# Patient Record
Sex: Male | Born: 1935 | Race: White | Hispanic: No | Marital: Married | Smoking: Never smoker
Health system: Southern US, Community
[De-identification: ages and names within clinical notes are randomized; demographics above are authoritative.]

## PROBLEM LIST (undated history)

## (undated) DIAGNOSIS — C801 Malignant (primary) neoplasm, unspecified: Secondary | ICD-10-CM

## (undated) DIAGNOSIS — G4733 Obstructive sleep apnea (adult) (pediatric): Secondary | ICD-10-CM

## (undated) DIAGNOSIS — G20A1 Parkinson's disease without dyskinesia, without mention of fluctuations: Secondary | ICD-10-CM

## (undated) DIAGNOSIS — I10 Essential (primary) hypertension: Secondary | ICD-10-CM

## (undated) DIAGNOSIS — I4891 Unspecified atrial fibrillation: Secondary | ICD-10-CM

## (undated) DIAGNOSIS — G20 Parkinson's disease: Secondary | ICD-10-CM

## (undated) HISTORY — PX: HERNIA REPAIR: SHX51

## (undated) HISTORY — DX: Parkinson's disease without dyskinesia, without mention of fluctuations: G20.A1

## (undated) HISTORY — DX: Unspecified atrial fibrillation: I48.91

## (undated) HISTORY — DX: Parkinson's disease: G20

## (undated) HISTORY — PX: HEMORRHOID SURGERY: SHX153

## (undated) HISTORY — DX: Essential (primary) hypertension: I10

## (undated) SURGERY — Surgical Case
Anesthesia: *Unknown

---

## 2004-05-17 ENCOUNTER — Ambulatory Visit: Payer: Self-pay | Admitting: Urology

## 2005-02-16 ENCOUNTER — Ambulatory Visit: Payer: Self-pay | Admitting: Radiation Oncology

## 2005-09-19 ENCOUNTER — Ambulatory Visit: Payer: Self-pay | Admitting: Ophthalmology

## 2006-02-15 ENCOUNTER — Ambulatory Visit: Payer: Self-pay | Admitting: Radiation Oncology

## 2006-03-01 ENCOUNTER — Ambulatory Visit: Payer: Self-pay | Admitting: Radiation Oncology

## 2008-12-24 ENCOUNTER — Encounter: Admission: RE | Admit: 2008-12-24 | Discharge: 2008-12-24 | Payer: Self-pay | Admitting: Neurology

## 2011-01-07 ENCOUNTER — Emergency Department: Payer: Self-pay | Admitting: Emergency Medicine

## 2013-11-06 ENCOUNTER — Emergency Department: Payer: Self-pay | Admitting: Emergency Medicine

## 2013-11-06 LAB — CBC WITH DIFFERENTIAL/PLATELET
BASOS PCT: 1.1 %
Basophil #: 0.1 10*3/uL (ref 0.0–0.1)
Eosinophil #: 0.4 10*3/uL (ref 0.0–0.7)
Eosinophil %: 5.8 %
HCT: 43.6 % (ref 40.0–52.0)
HGB: 14.2 g/dL (ref 13.0–18.0)
LYMPHS ABS: 1.4 10*3/uL (ref 1.0–3.6)
Lymphocyte %: 20.8 %
MCH: 29.6 pg (ref 26.0–34.0)
MCHC: 32.6 g/dL (ref 32.0–36.0)
MCV: 91 fL (ref 80–100)
Monocyte #: 0.8 x10 3/mm (ref 0.2–1.0)
Monocyte %: 12.3 %
NEUTROS ABS: 4 10*3/uL (ref 1.4–6.5)
NEUTROS PCT: 60 %
Platelet: 229 10*3/uL (ref 150–440)
RBC: 4.81 10*6/uL (ref 4.40–5.90)
RDW: 14.1 % (ref 11.5–14.5)
WBC: 6.7 10*3/uL (ref 3.8–10.6)

## 2013-11-06 LAB — COMPREHENSIVE METABOLIC PANEL
ALBUMIN: 3.4 g/dL (ref 3.4–5.0)
ALT: 30 U/L (ref 12–78)
Alkaline Phosphatase: 117 U/L
Anion Gap: 10 (ref 7–16)
BUN: 15 mg/dL (ref 7–18)
Bilirubin,Total: 0.9 mg/dL (ref 0.2–1.0)
CHLORIDE: 96 mmol/L — AB (ref 98–107)
CO2: 28 mmol/L (ref 21–32)
CREATININE: 1.13 mg/dL (ref 0.60–1.30)
Calcium, Total: 8.7 mg/dL (ref 8.5–10.1)
EGFR (African American): >60
GFR CALC NON AF AMER: 58 — AB
Glucose: 102 mg/dL — ABNORMAL HIGH (ref 65–99)
Osmolality: 269 (ref 275–301)
Potassium: 4.5 mmol/L (ref 3.5–5.1)
SGOT(AST): 29 U/L (ref 15–37)
SODIUM: 134 mmol/L — AB (ref 136–145)
Total Protein: 7.2 g/dL (ref 6.4–8.2)

## 2013-11-06 LAB — URINALYSIS, COMPLETE
BACTERIA: NONE SEEN
BILIRUBIN, UR: NEGATIVE
BLOOD: NEGATIVE
GLUCOSE, UR: NEGATIVE mg/dL (ref 0–75)
KETONE: NEGATIVE
LEUKOCYTE ESTERASE: NEGATIVE
NITRITE: NEGATIVE
PH: 6 (ref 4.5–8.0)
PROTEIN: NEGATIVE
SPECIFIC GRAVITY: 1.01 (ref 1.003–1.030)
SQUAMOUS EPITHELIAL: NONE SEEN
WBC UR: NONE SEEN /HPF (ref 0–5)

## 2013-11-06 LAB — LIPASE, BLOOD: LIPASE: 105 U/L (ref 73–393)

## 2014-12-30 ENCOUNTER — Ambulatory Visit (INDEPENDENT_AMBULATORY_CARE_PROVIDER_SITE_OTHER): Payer: Medicare Other | Admitting: Podiatry

## 2014-12-30 ENCOUNTER — Encounter: Payer: Self-pay | Admitting: Podiatry

## 2014-12-30 VITALS — BP 99/70 | HR 73 | Resp 18

## 2014-12-30 DIAGNOSIS — M79676 Pain in unspecified toe(s): Secondary | ICD-10-CM | POA: Diagnosis not present

## 2014-12-30 DIAGNOSIS — G20 Parkinson's disease: Secondary | ICD-10-CM | POA: Diagnosis not present

## 2014-12-30 DIAGNOSIS — B351 Tinea unguium: Secondary | ICD-10-CM

## 2014-12-30 NOTE — Progress Notes (Signed)
   Subjective:    Patient ID: Gavin Beltran., male    DOB: 09-11-1923, 81 y.o.   MRN: [id number]  HPI I HAVE SOME THICK TOENAILS AND ARE LONG AND DON'T GROW LIKE TOENAILS SHOULD AND I WAS A BORDERLINE DIABETIC BUT NOT SURE IF I AM NOW OR NOT AND DISCOLORED AND TENDER AND THEY CURL UP AND CURL IN.  He states that he has trouble walking not only due to the Parkinson's disease but also because of his painful toenails. He sees a curl up and run the shoes. He is in varus that his toenails are this long. He presents with his wife today.    Review of Systems  HENT: Positive for trouble swallowing.   Eyes: Positive for visual disturbance.  Gastrointestinal: Positive for diarrhea and constipation.  Skin:       Thick nails  Neurological: Positive for light-headedness.  Hematological: Bruises/bleeds easily.  All other systems reviewed and are negative.      Objective:   Physical Exam: 81 year old white male history of Parkinson's disease. Pulses are strongly palpable bilateral. Neurologic sensorium is intact per Semmes-Weinstein monofilament. Deep tendon reflexes are non-was supple muscle strength +5 over 5 dorsiflexion plantar flexors and inverters and everters all intrinsic musculature is intact. He's quite fidgety and does demonstrate resting tremors. Orthopedic evaluation demonstrates pes planus bilateral mild flexible hammertoe deformities bilateral. Otherwise all joints distal to the ankle for range of motion upper crepitation. Cutaneous evaluation and x-rays thick yellow dystrophic onychomycotic painful nails on palpation as well as debridement.        Assessment & Plan:  Assessment: Parkinson's disease with resting tremors and painful elongated toenails 1 through 5 bilateral.  Plan: Discussed etiology pathology conservative versus surgical therapies. Debridement of nails 1 through 5 bilateral and 613.

## 2015-03-29 ENCOUNTER — Ambulatory Visit: Payer: Medicare Other | Admitting: Podiatry

## 2015-03-31 ENCOUNTER — Ambulatory Visit (INDEPENDENT_AMBULATORY_CARE_PROVIDER_SITE_OTHER): Payer: Medicare Other | Admitting: Podiatry

## 2015-03-31 ENCOUNTER — Encounter: Payer: Self-pay | Admitting: Podiatry

## 2015-03-31 DIAGNOSIS — B351 Tinea unguium: Secondary | ICD-10-CM | POA: Diagnosis not present

## 2015-03-31 DIAGNOSIS — M79676 Pain in unspecified toe(s): Secondary | ICD-10-CM

## 2015-03-31 NOTE — Progress Notes (Signed)
He presents today with his wife with a chief complaint of painful elongated toenails bilateral.  Objective: Pulses are strongly palpable bilateral. No open wounds or lesions. His tenderness of thick yellow dystrophic with mycotic painful on palpation.  Assessment: Pain in limb segment onychomycosis 1 through 5 bilateral.  Plan: Debridement of toenails 1 through 5 bilateral cover service secondary to pain. Follow-up with me in 3 months.

## 2015-05-29 ENCOUNTER — Emergency Department
Admission: EM | Admit: 2015-05-29 | Discharge: 2015-05-29 | Disposition: A | Discharge status: Home / Self Care | Payer: Medicare Other | Attending: Emergency Medicine | Admitting: Emergency Medicine

## 2015-05-29 ENCOUNTER — Emergency Department: Payer: Medicare Other

## 2015-05-29 ENCOUNTER — Encounter: Payer: Self-pay | Admitting: Emergency Medicine

## 2015-05-29 DIAGNOSIS — Z79899 Other long term (current) drug therapy: Secondary | ICD-10-CM | POA: Insufficient documentation

## 2015-05-29 DIAGNOSIS — G20 Parkinson's disease: Secondary | ICD-10-CM | POA: Insufficient documentation

## 2015-05-29 DIAGNOSIS — K219 Gastro-esophageal reflux disease without esophagitis: Secondary | ICD-10-CM | POA: Diagnosis present

## 2015-05-29 DIAGNOSIS — Z7982 Long term (current) use of aspirin: Secondary | ICD-10-CM | POA: Insufficient documentation

## 2015-05-29 DIAGNOSIS — Q390 Atresia of esophagus without fistula: Secondary | ICD-10-CM | POA: Diagnosis not present

## 2015-05-29 LAB — COMPREHENSIVE METABOLIC PANEL
ALT: 21 U/L (ref 17–63)
AST: 31 U/L (ref 15–41)
Albumin: 4 g/dL (ref 3.5–5.0)
Alkaline Phosphatase: 99 U/L (ref 38–126)
Anion gap: 9 (ref 5–15)
BILIRUBIN TOTAL: 1.8 mg/dL — AB (ref 0.3–1.2)
BUN: 19 mg/dL (ref 6–20)
CO2: 26 mmol/L (ref 22–32)
Calcium: 9 mg/dL (ref 8.9–10.3)
Chloride: 100 mmol/L — ABNORMAL LOW (ref 101–111)
Creatinine, Ser: 1 mg/dL (ref 0.61–1.24)
Glucose, Bld: 102 mg/dL — ABNORMAL HIGH (ref 65–99)
POTASSIUM: 4.3 mmol/L (ref 3.5–5.1)
Sodium: 135 mmol/L (ref 135–145)
TOTAL PROTEIN: 7.3 g/dL (ref 6.5–8.1)

## 2015-05-29 LAB — CBC WITH DIFFERENTIAL/PLATELET
BASOS ABS: 0 10*3/uL (ref 0–0.1)
Basophils Relative: 1 %
EOS PCT: 2 %
Eosinophils Absolute: 0.2 10*3/uL (ref 0–0.7)
HEMATOCRIT: 41.3 % (ref 40.0–52.0)
Hemoglobin: 13.8 g/dL (ref 13.0–18.0)
LYMPHS ABS: 0.7 10*3/uL — AB (ref 1.0–3.6)
LYMPHS PCT: 10 %
MCH: 30 pg (ref 26.0–34.0)
MCHC: 33.3 g/dL (ref 32.0–36.0)
MCV: 90.2 fL (ref 80.0–100.0)
MONO ABS: 0.5 10*3/uL (ref 0.2–1.0)
MONOS PCT: 8 %
NEUTROS ABS: 5.6 10*3/uL (ref 1.4–6.5)
Neutrophils Relative %: 79 %
PLATELETS: 221 10*3/uL (ref 150–440)
RBC: 4.58 MIL/uL (ref 4.40–5.90)
RDW: 14.2 % (ref 11.5–14.5)
WBC: 7.1 10*3/uL (ref 3.8–10.6)

## 2015-05-29 MED ORDER — PANTOPRAZOLE SODIUM 20 MG PO TBEC: 20.0000 mg | DELAYED_RELEASE_TABLET | Freq: Every day | ORAL | Status: AC

## 2015-05-29 NOTE — ED Provider Notes (Signed)
Time Seen: Approximately *1417  I have reviewed the triage notes  Chief Complaint: Gastroesophageal Reflux   History of Present Illness: Gavin Beltran. is a 82 y.o. male who states over the last 2 days with breakfast that he's had grits and juice. He states on both days it felt like something got stuck in the lower chest region. He states today when he got stuck it did not seem to resolve itself and he was unable to tolerate his own oral secretions at home. He describes spitting up a lot. Denies any chest pain or shortness of breath. Patient was in contact with his primary physician was discussed whether or not he would need esophageal dilation therapy. Patient states he is now able tolerate his oral secretions but it still feels like something may be stuck.   Past Medical History  Diagnosis Date  . Parkinson disease ([place])     There are no active problems to display for this patient.   History reviewed. No pertinent past surgical history.  History reviewed. No pertinent past surgical history.  Current Outpatient Rx  Name  Route  Sig  Dispense  Refill  . amiodarone (PACERONE) 200 MG tablet      TAKE ONE-HALF (1/2) TABLET DAILY         . amiodarone (PACERONE) 200 MG tablet               . amoxicillin (AMOXIL) 875 MG tablet               . aspirin EC 81 MG tablet   Oral   Take by mouth.         Marland Kitchen azithromycin (ZITHROMAX) 250 MG tablet               . Multiple Vitamin (MULTI-VITAMINS) TABS   Oral   Take by mouth.         . neomycin-polymyxin b-dexamethasone (MAXITROL) 3.5-10000-0.1 OINT               . pantoprazole (PROTONIX) 20 MG tablet   Oral   Take 1 tablet (20 mg total) by mouth daily.   30 tablet   1   . potassium chloride SA (K-DUR,KLOR-CON) 20 MEQ tablet      TAKE 1 TABLET DAILY         . PROAIR HFA 108 (90 BASE) MCG/ACT inhaler                 Dispense as written.   . ramipril (ALTACE) 5 MG capsule      TAKE 1  CAPSULE DAILY         . rOPINIRole (REQUIP) 1 MG tablet      TAKE 2 TABLETS THREE TIMES A DAY           Allergies:  Gabapentin  Family History: History reviewed. No pertinent family history.  Social History: Social History  Substance Use Topics  . Smoking status: Never Smoker   . Smokeless tobacco: Never Used  . Alcohol Use: No     Review of Systems:   10 point review of systems was performed and was otherwise negative:  Constitutional: No fever Eyes: No visual disturbances ENT: No sore throat, ear pain Cardiac: No chest pain Respiratory: No shortness of breath, wheezing, or stridor Abdomen: No abdominal pain, no vomiting, No diarrhea Endocrine: No weight loss, No night sweats Extremities: No peripheral edema, cyanosis Skin: No rashes, easy bruising Neurologic: No focal weakness, trouble with speech or  swollowing Urologic: No dysuria, Hematuria, or urinary frequency Patient has a known history of Parkinson's and has a baseline resting tremor  Physical Exam:  ED Triage Vitals  Enc Vitals Group     BP 05/29/15 1353 124/83 mmHg     Pulse Rate 05/29/15 1353 79     Resp 05/29/15 1353 18     Temp 05/29/15 1353 97.9 F (36.6 C)     Temp Source 05/29/15 1353 Oral     SpO2 05/29/15 1353 98 %     Weight 05/29/15 1353 160 lb (72.576 kg)     Height 05/29/15 1353 5\' 8"  (1.727 m)     Head Cir --      Peak Flow --      Pain Score 05/29/15 1354 0     Pain Loc --      Pain Edu? --      Excl. in [place]? --     General: Awake , Alert , and Oriented times 3; GCS 15 affects of Parkinson's Head: Normal cephalic , atraumatic Eyes: Pupils equal , round, reactive to light Nose/Throat: No nasal drainage, patent upper airway without erythema or exudate.  Neck: Supple, Full range of motion, No anterior adenopathy or palpable thyroid masses Lungs: Clear to ascultation without wheezes , rhonchi, or rales Heart: Regular rate, regular rhythm without murmurs , gallops , or  rubs Abdomen: Soft, non tender without rebound, guarding , or rigidity; bowel sounds positive and symmetric in all 4 quadrants. No organomegaly .        Extremities: 2 plus symmetric pulses. No edema, clubbing or cyanosis Neurologic: normal ambulation, Motor symmetric without deficits, sensory intact Skin: warm, dry, no rashes   Labs:   All laboratory work was reviewed including any pertinent negatives or positives listed below:  Labs Reviewed  CBC WITH DIFFERENTIAL/PLATELET - Abnormal; Notable for the following:    Lymphs Abs 0.7 (*)    All other components within normal limits  COMPREHENSIVE METABOLIC PANEL - Abnormal; Notable for the following:    Chloride 100 (*)    Glucose, Bld 102 (*)    Total Bilirubin 1.8 (*)    All other components within normal limits     Radiology:      CLINICAL DATA: Difficulty swallowing solid  EXAM: CHEST 2 VIEW  COMPARISON: 01/07/2011  FINDINGS: The heart size and mediastinal contours are within normal limits. Both lungs are clear. The visualized skeletal structures are unremarkable.  IMPRESSION: No active cardiopulmonary disease.   Electronically Signed By: Inez Catalina M.D. On: 05/29/2015 15:06 I personally reviewed the radiologic studies   P  ED Course:  The patient was able tolerate water here in emergency department repeat exam shows that he is asymptomatic. I briefly reviewed the case with Dr. Rayann Heman. We both agree the patient can be discharged at this time and is been advised to have a liquid diet until he can be evaluated on Monday. He most likely does require an upper endoscopic exam. The patient does not appear to have any lesions in his chest x-ray will be causing some form of an external obstruction. He also has no indications of aspiration pneumonia, etc. His swallowing mechanism is intact with his Parkinson's and again we felt we could attempt outpatient management at this time. All questions and concerns were  addressed at the bedside and the patient is medications were reviewed and he was started on protonix Dr. Rayann Heman advised that his office would contact them on Monday to schedule follow-up  and I advised him at the bedside that if he didn't hear by the afternoon to call Dr. Jackalyn Lombard office and all information was provided   Assessment:  Esophageal atresia   Final Clinical Impression: *  Final diagnoses:  Esophageal atresia     Plan: Outpatient management Patient was advised to return immediately if condition worsens. Patient was advised to follow up with their primary care physician or other specialized physicians involved in their outpatient care             Daymon Larsen, MD 05/29/15 1920

## 2015-05-29 NOTE — ED Notes (Signed)
Reports after he eats it comes back up in his throat and has been told by Dr Manuella Ghazi that he needs his esophagus stretched. No resp distress

## 2015-05-29 NOTE — ED Notes (Signed)
Pt given water. Pt able to tolerate the water without any issues.

## 2015-06-03 ENCOUNTER — Other Ambulatory Visit: Payer: Self-pay | Admitting: Student

## 2015-06-03 ENCOUNTER — Other Ambulatory Visit: Payer: Self-pay | Admitting: Neurology

## 2015-06-03 DIAGNOSIS — R1310 Dysphagia, unspecified: Secondary | ICD-10-CM

## 2015-06-03 DIAGNOSIS — T17908A Unspecified foreign body in respiratory tract, part unspecified causing other injury, initial encounter: Secondary | ICD-10-CM

## 2015-06-08 ENCOUNTER — Ambulatory Visit
Admission: RE | Admit: 2015-06-08 | Discharge: 2015-06-08 | Disposition: A | Discharge status: Home / Self Care | Payer: Medicare Other | Source: Ambulatory Visit | Attending: Neurology | Admitting: Neurology

## 2015-06-08 DIAGNOSIS — R1312 Dysphagia, oropharyngeal phase: Secondary | ICD-10-CM

## 2015-06-08 DIAGNOSIS — T17908A Unspecified foreign body in respiratory tract, part unspecified causing other injury, initial encounter: Secondary | ICD-10-CM

## 2015-06-08 DIAGNOSIS — R1310 Dysphagia, unspecified: Secondary | ICD-10-CM | POA: Diagnosis not present

## 2015-06-09 NOTE — Therapy (Signed)
[facility] [place], [place], [address] Phone: [phone]   Fax:     Modified Barium Swallow  Patient Details  Name: Gavin Beltran. MRN: [id number] Date of Birth: [birth year] No Data Recorded  Encounter Date: 06/08/2015      End of Session - 06/09/15 [id number]    Visit Number 1   Number of Visits 1   Date for SLP Re-Evaluation 06/08/15   SLP Start Time 1300   SLP Stop Time  1355   SLP Time Calculation (min) 55 min   Activity Tolerance Patient tolerated treatment well      Past Medical History  Diagnosis Date  . Parkinson disease ([place])     No past surgical history on file.  There were no vitals filed for this visit.  Visit Diagnosis: Oropharyngeal dysphagia  Aspiration into airway, initial encounter - Plan: DG OP Swallowing Func-Medicare/Speech Path, DG OP Swallowing Func-Medicare/Speech Path     Subjective: Patient behavior: (alertness, ability to follow instructions, etc.): Patient verbal and able to follow directions.  Patient exhibits hypophonia and hoarse vocal quality.   Chief complaint: difficulty swallowing, foods getting stuck   Objective:  Radiological Procedure: A videoflouroscopic evaluation of oral-preparatory, reflex initiation, and pharyngeal phases of the swallow was performed; as well as a screening of the upper esophageal phase.  I. POSTURE: Upright in MBS chair  II. VIEW: Lateral  III. COMPENSATORY STRATEGIES: Voluntary swallow, minimal clearance of solid pharyngeal residue  IV. BOLUSES ADMINISTERED:   Thin Liquid: 2 small, 2 rapid consecutive swallows   Nectar-thick Liquid: 1 medium size bolus    Puree: 2 teaspoon boluses   Mechanical Soft: 1/4 graham cracker in applesauce    Barium tablet  V. RESULTS OF EVALUATION: A. ORAL PREPARATORY PHASE: (The lips, tongue, and velum are observed for strength and coordination): Mild disorganization       **Overall Severity  Rating: Mild   B. SWALLOW INITIATION/REFLEX: (The reflex is normal if "triggered" by the time the bolus reached the base of the tongue): Mild delay with liquids   **Overall Severity Rating: Mild  C. PHARYNGEAL PHASE: (Pharyngeal function is normal if the bolus shows rapid, smooth, and continuous transit through the pharynx and there is no pharyngeal residue after the swallow): reduced hyolaryngeal movement, reduced pharyngeal pressure generation, moderate residue with solids, less with thinner consistencies  **Overall Severity Rating: Mild-moderate  D. LARYNGEAL PENETRATION: (Material entering into the laryngeal inlet/vestibule but not aspirated) Transient penetration with nectar-thick and thin liquids   ASPIRATION: None  E. ESOPHAGEAL PHASE: (Screening of the upper esophagus): barium tablet traveled through without significant stasis  ASSESSMENT: This 82 year old man; with Parkinsons's disease and difficulty swallowing; is presenting with mild oropharyngeal dysphagia.  Oral control of the bolus including oral hold, rotary mastication, and anterior to posterior transfer is mildly disorganized.  Timing of the pharyngeal swallow is mildly delayed.  Pharyngeal aspects of the swallow including hyolaryngeal excursion, pharyngeal pressure generation, epiglottic inversion, duration/amplitude of UES opening, and laryngeal vestibule closure at the height of the swallow are reduced.  There is moderate pharyngeal residue of solid consistencies, mild pharyngeal residue with nectar thick liquids, and trace-to-mild residue with thin liquids.  The patient demonstrates significantly improved hyolaryngeal movement with thinner consistencies, indicative of increased effort and resulting in less pharyngeal residue.  There was transient laryngeal penetration, without aspiration, with liquid consistencies.  A 12.5 mm barium tablet moved through the esophagus and into the stomach without  significant stasis along the way.   Recommend the patient continue his usual diet, alternating solids and liquids to aid pharyngeal clearance.  In view of the hypophonia, hoarse vocal quality, and reduced effort of pharyngeal swallow; it is recommended that the patient participate in high effort/high intensity vocal exercises (such as the LSVT-LOUD program).  LSVT-LOUD improves neuromuscular control of the entire upper aerodigestive tract, improving oral tongue and tongue base function during the oral and pharyngeal phases of swallowing as well as improving vocal intensity.    PLAN/RECOMMENDATIONS:   A. Diet: regular   B. Swallowing Precautions: Alternate liquids and solids   C. Recommended consultation to: per MD recommendations   D. Therapy recommendations: LSVT-LOUD   E. Results and recommendations were discussed with the patient and his wife and the final report will be routed to the referring MD.            G-Codes - 06/22/15 0944    Functional Assessment Tool Used MBS, clinical judgment   Functional Limitations Swallowing   Swallow Current Status [id number]) At least 20 percent but less than 40 percent impaired, limited or restricted   Swallow Goal Status [id number]) At least 20 percent but less than 40 percent impaired, limited or restricted   Swallow Discharge Status [phone]) At least 20 percent but less than 40 percent impaired, limited or restricted          Problem List There are no active problems to display for this patient.  Gavin Sea, MS/CCC- SLP  Gavin Beltran June 22, 2015, 9:45 AM  [facility] [place], [place], [address] Phone: [phone]   Fax:     Name: Gavin Beltran. MRN: [id number] Date of Birth: [birth year]

## 2015-06-10 ENCOUNTER — Ambulatory Visit: Payer: Medicare Other

## 2015-06-30 ENCOUNTER — Ambulatory Visit (INDEPENDENT_AMBULATORY_CARE_PROVIDER_SITE_OTHER): Payer: Medicare Other | Admitting: Podiatry

## 2015-06-30 ENCOUNTER — Encounter: Payer: Self-pay | Admitting: Podiatry

## 2015-06-30 ENCOUNTER — Ambulatory Visit: Payer: Medicare Other | Admitting: Podiatry

## 2015-06-30 DIAGNOSIS — B351 Tinea unguium: Secondary | ICD-10-CM | POA: Diagnosis not present

## 2015-06-30 DIAGNOSIS — M79676 Pain in unspecified toe(s): Secondary | ICD-10-CM

## 2015-06-30 NOTE — Progress Notes (Signed)
Gavin Beltran presents today with a chief complaint of painful elongated toenails.  Objective: Vital signs are stable alert and oriented 3. Pulses are palpable. Toenails are thick yellow dystrophic with mycotic and painful on palpation.  Assessment: Pain in limb secondary to onychomycosis 1 through 5 bilateral.  Plan: Follow-up with him in 3 months debrided toenails 1 through 5 bilateral.

## 2015-07-05 ENCOUNTER — Ambulatory Visit: Payer: Medicare Other | Admitting: Speech Pathology

## 2015-07-06 ENCOUNTER — Ambulatory Visit: Payer: Medicare Other | Admitting: Speech Pathology

## 2015-07-07 ENCOUNTER — Ambulatory Visit: Payer: Medicare Other | Admitting: Speech Pathology

## 2015-07-08 ENCOUNTER — Ambulatory Visit: Payer: Medicare Other | Attending: Otolaryngology

## 2015-07-08 ENCOUNTER — Ambulatory Visit: Payer: Medicare Other | Admitting: Speech Pathology

## 2015-07-08 DIAGNOSIS — I4891 Unspecified atrial fibrillation: Secondary | ICD-10-CM | POA: Insufficient documentation

## 2015-07-08 DIAGNOSIS — G4733 Obstructive sleep apnea (adult) (pediatric): Secondary | ICD-10-CM | POA: Diagnosis not present

## 2015-07-08 DIAGNOSIS — R0683 Snoring: Secondary | ICD-10-CM | POA: Insufficient documentation

## 2015-07-08 DIAGNOSIS — I10 Essential (primary) hypertension: Secondary | ICD-10-CM | POA: Diagnosis present

## 2015-07-12 ENCOUNTER — Ambulatory Visit: Payer: Medicare Other | Admitting: Speech Pathology

## 2015-07-13 ENCOUNTER — Encounter: Payer: Medicare Other | Admitting: Speech Pathology

## 2015-07-13 ENCOUNTER — Ambulatory Visit: Payer: Medicare Other | Attending: Otolaryngology

## 2015-07-13 ENCOUNTER — Ambulatory Visit: Payer: Medicare Other | Admitting: Speech Pathology

## 2015-07-13 DIAGNOSIS — G4733 Obstructive sleep apnea (adult) (pediatric): Secondary | ICD-10-CM | POA: Insufficient documentation

## 2015-07-13 DIAGNOSIS — R0683 Snoring: Secondary | ICD-10-CM | POA: Diagnosis not present

## 2015-07-14 ENCOUNTER — Ambulatory Visit: Payer: Medicare Other | Admitting: Speech Pathology

## 2015-07-14 ENCOUNTER — Encounter: Payer: Medicare Other | Admitting: Speech Pathology

## 2015-07-15 ENCOUNTER — Encounter: Payer: Medicare Other | Admitting: Speech Pathology

## 2015-07-15 ENCOUNTER — Ambulatory Visit: Payer: Medicare Other | Admitting: Speech Pathology

## 2015-07-16 ENCOUNTER — Encounter: Payer: Medicare Other | Admitting: Speech Pathology

## 2015-07-19 ENCOUNTER — Ambulatory Visit: Payer: Medicare Other | Admitting: Speech Pathology

## 2015-07-20 ENCOUNTER — Ambulatory Visit: Payer: Medicare Other | Admitting: Speech Pathology

## 2015-07-20 ENCOUNTER — Encounter: Payer: Medicare Other | Admitting: Speech Pathology

## 2015-07-21 ENCOUNTER — Encounter: Payer: Medicare Other | Admitting: Speech Pathology

## 2015-07-21 ENCOUNTER — Ambulatory Visit: Payer: Medicare Other | Admitting: Speech Pathology

## 2015-07-22 ENCOUNTER — Ambulatory Visit: Payer: Medicare Other | Admitting: Speech Pathology

## 2015-07-22 ENCOUNTER — Encounter: Payer: Medicare Other | Admitting: Speech Pathology

## 2015-07-23 ENCOUNTER — Encounter: Payer: Medicare Other | Admitting: Speech Pathology

## 2015-07-26 ENCOUNTER — Ambulatory Visit: Payer: Medicare Other | Admitting: Speech Pathology

## 2015-07-27 ENCOUNTER — Ambulatory Visit: Payer: Medicare Other | Admitting: Speech Pathology

## 2015-07-27 ENCOUNTER — Encounter: Payer: Medicare Other | Admitting: Speech Pathology

## 2015-07-28 ENCOUNTER — Encounter: Payer: Medicare Other | Admitting: Speech Pathology

## 2015-07-28 ENCOUNTER — Ambulatory Visit: Payer: Medicare Other | Admitting: Speech Pathology

## 2015-07-29 ENCOUNTER — Encounter: Payer: Medicare Other | Admitting: Speech Pathology

## 2015-07-29 ENCOUNTER — Ambulatory Visit: Payer: Medicare Other | Admitting: Speech Pathology

## 2015-07-30 ENCOUNTER — Encounter: Payer: Medicare Other | Admitting: Speech Pathology

## 2015-08-02 ENCOUNTER — Ambulatory Visit: Payer: Medicare Other | Admitting: Speech Pathology

## 2015-08-03 ENCOUNTER — Encounter: Payer: Medicare Other | Admitting: Speech Pathology

## 2015-08-04 ENCOUNTER — Encounter: Payer: Medicare Other | Admitting: Speech Pathology

## 2015-08-05 ENCOUNTER — Encounter: Payer: Medicare Other | Admitting: Speech Pathology

## 2015-08-06 ENCOUNTER — Encounter: Payer: Medicare Other | Admitting: Speech Pathology

## 2015-08-10 ENCOUNTER — Encounter: Payer: Medicare Other | Admitting: Speech Pathology

## 2015-10-04 ENCOUNTER — Ambulatory Visit: Payer: Medicare Other | Admitting: Podiatry

## 2015-10-13 ENCOUNTER — Ambulatory Visit (INDEPENDENT_AMBULATORY_CARE_PROVIDER_SITE_OTHER): Payer: Medicare Other | Admitting: Podiatry

## 2015-10-13 ENCOUNTER — Encounter: Payer: Self-pay | Admitting: Podiatry

## 2015-10-13 DIAGNOSIS — M79676 Pain in unspecified toe(s): Secondary | ICD-10-CM | POA: Diagnosis not present

## 2015-10-13 DIAGNOSIS — B351 Tinea unguium: Secondary | ICD-10-CM

## 2015-10-13 NOTE — Progress Notes (Signed)
He presents today with a chief complaint of painful elongated toenails 1 through 5 bilateral.  Objective: Vital signs are stable alert and oriented 3. Pulses are palpable. Neurologic sensorium is intact. Deep tendon reflexes are intact. His toenails are thick yellow dystrophic onychomycotic extremely painful on palpation and ambulation.  Assessment: Pain and limp secondary to onychomycosis.  Plan: Debridement of toenails 1 through 5 bilateral covered service secondary to the pain thickness and limiting ambulation.

## 2015-11-04 ENCOUNTER — Ambulatory Visit: Payer: Medicare Other | Attending: Neurology | Admitting: Speech Pathology

## 2015-11-04 ENCOUNTER — Encounter: Payer: Self-pay | Admitting: Speech Pathology

## 2015-11-04 DIAGNOSIS — M6281 Muscle weakness (generalized): Secondary | ICD-10-CM | POA: Insufficient documentation

## 2015-11-04 DIAGNOSIS — R293 Abnormal posture: Secondary | ICD-10-CM | POA: Diagnosis present

## 2015-11-04 DIAGNOSIS — R2681 Unsteadiness on feet: Secondary | ICD-10-CM | POA: Diagnosis present

## 2015-11-04 DIAGNOSIS — R490 Dysphonia: Secondary | ICD-10-CM | POA: Diagnosis present

## 2015-11-04 NOTE — Therapy (Signed)
[facility] [address] [place], [place], [address] Phone: [phone]   Fax:  [phone]  Speech Language Pathology Evaluation  Patient Details  Name: Gavin Beltran. MRN: [id number] Date of Birth: 06-02-1923 Referring Provider: Dr. Manuella Ghazi  Encounter Date: 11/04/2015      End of Session - 11/04/15 1617    Visit Number 1   Number of Visits 1   Date for SLP Re-Evaluation 11/04/15   SLP Start Time 1000   SLP Stop Time  1045   SLP Time Calculation (min) 45 min   Activity Tolerance Patient tolerated treatment well      Past Medical History  Diagnosis Date   Parkinson disease ([place])     History reviewed. No pertinent past surgical history.  There were no vitals filed for this visit.      Subjective Assessment - 11/04/15 1616    Subjective He reports that his voice has been changing for several years now and that his wife often tells him he is mumbling and she can't hear him. He feels that sometimes he talks loudly but sometimes not.   Patient is accompained by: Family member   Currently in Pain? No/denies            SLP Evaluation [facility] - 11/04/15 0001    SLP Visit Information   SLP Received On 11/04/15   Referring Provider Dr. Manuella Ghazi   Onset Date 10/22/15   Medical Diagnosis Parkinson's disease   Subjective   Subjective This 82 year old man with a diagnosis of Parkinson's disease was referred to [facility] by Dr. Manuella Ghazi. He reports that his voice has been changing for several years now and that his wife often tells him he is mumbling and she can't hear him.   Oral Motor/Sensory Function   Overall Oral Motor/Sensory Function Appears within functional limits for tasks assessed   Motor Speech   Overall Motor Speech Appears within functional limits for tasks assessed         Perceptual Voice Evaluation   Patient Quality of Life Survey: Voice Handicap Index-10 Score of 7.  A score of 10 or higher indicates  perceived handicap Maximum phonation time for sustained ah: 20 seconds Mean intensity during sustained ah: 67 dB Mean intensity sustained during conversational speech: 65 dB Average fundamental frequency during sustained ah: 127.6 Hz Average time patient was able to sustain /s/: 15 seconds Average time patient was able to sustain /z/: 17 seconds s/z ratio : 1/ 1.1 Pitch range was normal during pitch glides and conversational speech. Visi-Pitch: Museum/gallery exhibitions officer (MDVP) MDVP extracts objective quantitative values (Relative Average Perturbation, Shimmer, Voice Turbulence Index, and Noise to Harmonic Ratio) on sustained phonation, which are displayed graphically and numerically in comparison to a built-in normative database.  The patient exhibited values outside the norm for Relative Average Perturbation and Shimmer.  Average fundamental frequency was .9 STD below the average for age and gender. The patient improved all parameters when asked to mimic the clinician using a loud voice. Stimulability: [name] was highly stimulable for better quality, louder voice. Effective cues included verbal instruction on breath support, flow phonation, and repeated cues to be louder but not shout.              SLP Education - 11/04/15 1617    Education provided Yes   Education Details Role of SLP in treating hypophonia caused by PD   Person(s) Educated Spouse;Patient   Methods Explanation  Comprehension Verbalized understanding              Plan - 11/04/15 1618    Clinical Impression Statement This 82 year old man under the care of Dr. Manuella Ghazi is presenting with mild hypophonia without discomfort.  The patient demonstrates reduced breath support, mild hoarseness, and occasional sporadic voicing. He will benefit from voice therapy for education, to improve breath support, improve tone focus, and learn techniques to increase loudness without strain or pain.  The patient and  his wife were under the impression that Dr. Manuella Ghazi wanted him to receive speech therapy before getting physical therapy.  This has been clarified and the patient is scheduled for physical therapy 11/15/2015.  Mr. Perrot has opted to forego speech therapy in the intervening weeks.   Speech Therapy Frequency One time visit   Treatment/Interventions Functional tasks;Compensatory techniques;SLP instruction and feedback;Compensatory strategies;Patient/family education  Voice   Potential Considerations Family/community support;Ability to learn/carryover information;Co-morbidities;Cooperation/participation level;Medical prognosis;Previous level of function;Financial resources;Severity of impairments;Pain level   Consulted and Agree with Plan of Care Patient;Family member/caregiver   Family Member Consulted Wife      Patient will benefit from skilled therapeutic intervention in order to improve the following deficits and impairments:   Dysphonia    Problem List There are no active problems to display for this patient.   Gavin Beltran 11/04/2015, 4:20 PM  [facility] [address] [place], [place], [address] Phone: [phone]   Fax:  [phone]  Name: Gavin Beltran. MRN: [id number] Date of Birth: November 27, 1923

## 2015-11-09 ENCOUNTER — Encounter: Payer: Medicare Other | Admitting: Speech Pathology

## 2015-11-11 ENCOUNTER — Encounter: Payer: Medicare Other | Admitting: Speech Pathology

## 2015-11-15 ENCOUNTER — Encounter: Payer: Self-pay | Admitting: Physical Therapy

## 2015-11-15 ENCOUNTER — Ambulatory Visit: Payer: Medicare Other | Admitting: Physical Therapy

## 2015-11-15 DIAGNOSIS — R490 Dysphonia: Secondary | ICD-10-CM | POA: Diagnosis not present

## 2015-11-15 DIAGNOSIS — M6281 Muscle weakness (generalized): Secondary | ICD-10-CM

## 2015-11-15 DIAGNOSIS — R293 Abnormal posture: Secondary | ICD-10-CM

## 2015-11-15 DIAGNOSIS — R2681 Unsteadiness on feet: Secondary | ICD-10-CM

## 2015-11-15 NOTE — Therapy (Signed)
[place] MAIN [facility] SERVICES [address] [place], [place], [address] Phone: [phone]   Fax:  [phone]  Physical Therapy Evaluation  Patient Details  Name: Gavin Beltran. MRN: [id number] Date of Birth: 07-11-1923 Referring Provider: Dr. Manuella Ghazi  Encounter Date: 11/15/2015      PT End of Session - 11/15/15 1232    Visit Number 1   Number of Visits 17   Date for PT Re-Evaluation 01/10/16   Authorization Type G Code 1   Authorization Time Period 10   PT Start Time 1102   PT Stop Time 1203   PT Time Calculation (min) 61 min   Equipment Utilized During Treatment Gait belt   Activity Tolerance Patient tolerated treatment well   Behavior During Therapy WFL for tasks assessed/performed      Past Medical History  Diagnosis Date  . Parkinson disease ([place])   . Hypertension     controlled by meds  . Atrial fibrillation ([place])     controlled    History reviewed. No pertinent past surgical history.  There were no vitals filed for this visit.       Subjective Assessment - 11/15/15 1106    Subjective Patient is a 82 y.o. 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 He reports that he has been having issues for a year but since the last 5-6 months he has had an increase in his number of falls, is now extremely kyphotic when walking/sitting, feels like he is unable to straighten up, and has pain in his low back on the R when waking in the am. Patient reports he is able to sleep well. He states he has never had physical therapy and was referred by his doctor but was unsure why.   Patient is accompained by: Family member   Pertinent History Factors affecting Rehab: age, progressive disease, family support, limited activity level   Limitations Walking   How long can you sit comfortably? N/A   How long can you stand comfortably? patient states he is uncertain how to answer   How  long can you walk comfortably? 120 feet   Patient Stated Goals improve balance because he has difficulty turning quickly    Currently in Pain? No/denies   Aggravating Factors  am directly after waking up, trunk flexion   Pain Relieving Factors biofreeze, moving in the morning    Effect of Pain on Daily Activities decreased activity level            [facility] PT Assessment - 11/15/15 0001    Assessment   Medical Diagnosis PD   Referring Provider Dr. Manuella Ghazi   Onset Date/Surgical Date 07/31/15   Hand Dominance Right   Next MD Visit 6 months   Prior Therapy No PT previously   Precautions   Precautions Fall   Restrictions   Weight Bearing Restrictions No   Balance Screen   Has the patient fallen in the past 6 months Yes   How many times? 4   Has the patient had a decrease in activity level because of a fear of falling?  Yes   Is the patient reluctant to leave their home because of a fear of falling?  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   Charity fundraiser Status Within Functional Limits for tasks assessed   Attention Focused   Sensation   Light Touch Appears Intact   Additional Comments Light touch is intact with decreased feeling in R fingers due to previous wrist fracture   Coordination   Gross Motor Movements are Fluid and Coordinated Yes   Finger Nose Finger Test mild deficit with past pointing to SPT's finger   Heel Shin Test appeared intact    Posture/Postural Control   Posture Comments Patient is extremely kyphotic with forward head in both seated/standing with ability to straigthen in seated, not standing, possible slight scoliosis,   AROM   Overall AROM Comments Cervical ROM limited in B cervical lateral flexion and rotation; BUE WFL, BLE WFL   Strength   Overall Strength Comments Gross BUE strength assessment revealed 4 to 4-/5 strength with R slightly weaker the L UE.    Right Hip Flexion 3+/5   Right Hip ABduction 4-/5  seated   Right Hip ADduction 4-/5  seated   Left Hip Flexion 4-/5   Left Hip ABduction 4-/5  seated   Left Hip ADduction 4-/5  seated   Right Knee Flexion 3+/5   Right Knee Extension 4-/5   Left Knee Flexion 3+/5   Left Knee Extension 4-/5   Right Ankle Dorsiflexion 4/5   Left Ankle Dorsiflexion 4/5   Palpation   Palpation comment Patient reports no tenderness, appears to have mild scolosis through observation with slighlty raised L side compared to R.   Transfers   Comments patient able to perform sit to stand without use of BUE, min assist to decrease loss of stability backwards, and demonstrates poor eccentric control when lowering   Ambulation/Gait   Gait Comments Patient continues to be extremely kyphotic with slight lateral lean to R, mild hip drop on RLE, decreased stride length, increased base of support; mild off balance with scissoring periodically   Standardized Balance Assessment   Five times sit to stand comments  24.2 seconds, >60 y.o., >14 seconds indicates at risk for falls   10 Meter Walk 0.74 m/s, CGA, no AD patient is a limited comunity ambulator, increased risk for falls   Timed Up and Go Test   Normal TUG (seconds) 15.4   TUG Comments CGA, no AD, TUG indicates patient is at high fall risk   High Level Balance   High Level Balance Comments Static balance with BLE support is good with eyes open/close; able to stand in tandem x 15 seconds bilaterally, difficulty (L>R); unable able  to perform single leg stance greater than 3 seconds.     Treatment:  Seated POWER exercises:  Seated posture: 2 x 10, VCs to maintain upright, decrease performance of hip extension, and increase speed. Seated rock: 2 x 10, VCs to increase weight shift, reach arm greater towards the sky, and extend LE.  Handout given. Instructed patient to only perform first 2 exercises.  PT Education - 11/15/15 1231    Education provided Yes   Education Details HEP initiated   [employer]) Educated Patient;Spouse   Methods Explanation;Demonstration;Verbal cues   Comprehension Verbalized understanding;Returned demonstration;Verbal cues required             PT Long Term Goals - 11/15/15 1415    PT LONG TERM GOAL #1   Title Patient will be independent in HEP in order to increase patient's ability to maintain gains achieved in therapy and assist with return to PLOF.    Time 8   Period Weeks   Status New   PT LONG TERM GOAL #2   Title Patient will increase overall strength to 4/5 in BLE to increase ROM/strength throughout functional activities performed in his ADLs.    Time 8   Period Weeks   Status New   PT LONG TERM GOAL #3   Title Patient will be able to independently ascend/descend 5 steps with R unilateral hand rail in order to allow for safe entrance/exit from his home.    Time 8   Period Weeks   Status New   PT LONG TERM GOAL #4   Title Patient will decrease the time it takes to perform 5 times sit to stand to <14 seconds without UE use in order to decrease his risk for falls.     Time 8   Period Weeks   Status New   PT LONG TERM GOAL #5   Title Patient will increase his 10 m walk test time to >1.0 m/s in order to be a community ambulator at decreased risk for falls.    Time 8   Period Weeks   Status New   Additional Long Term Goals   Additional Long Term Goals Yes   PT LONG TERM GOAL #6   Title Patient's Berg balance score will improve by 7  points in order to decrease patient's risk for falls and increase safety in his home.   Time 8   Period Weeks   Status New               Plan - 11/15/15 1233    Clinical Impression Statement Patient is an 82 y.o. male with PD disease that has recently resulted in an increased number of falls, decreased safety, low back pain, and extreme kyphosis. Patient is fairly strong but does have general LE weakness throughout. Patient is extremely kyphotic with possible scoliosis, he is able to attain upright when in seated but maintains kyphosis although decreased. Patient is at fall risk based on 10 meter walk, TUG, and 5x sit to stand. With ambulation patient has decreased ability to attain upright posture in standing with extreme kyphosis, decreased stride length, and increased base of support. He also has scissoring of his gait intermittently and requires CGA with ambulation. Patient has RW and 2 canes, but his wife notes that he does not like using them so he doesn't. Patient has fair static balance except poor single leg stance ability and reported deficits in dynamic balance. Patient would benefit from continued PT in order to address balance, safety, LE strength, postural dysfunction, and limited activity endurance.    Rehab Potential Good   Clinical Impairments Affecting Rehab Potential Postive Factors: motivated, family support; Negative Factors: degenerative disease, age, chronic issues Clinical Presenstation: Evolving-falls risk, progressive disease   PT Frequency 2x / week   PT Duration 8 weeks   PT Treatment/Interventions ADLs/Self Care Home Management;Aquatic Therapy;Biofeedback;Cryotherapy;Electrical Stimulation;Moist Heat;Traction;DME Instruction;Gait training;Stair training;Functional mobility  training;Therapeutic activities;Therapeutic exercise;Balance training;Neuromuscular re-education;Patient/family education;Manual techniques;Passive range of motion;Energy conservation   PT Next  Visit Plan assess low back, balance assessment Merrilee Jansky)   PT Home Exercise Plan HEP initiated    Consulted and Agree with Plan of Care Patient;Family member/caregiver   Family Member Consulted wife      Patient will benefit from skilled therapeutic intervention in order to improve the following deficits and impairments:  Decreased activity tolerance, Decreased balance, Decreased coordination, Decreased endurance, Decreased mobility, Decreased range of motion, Decreased safety awareness, Decreased strength, Difficulty walking, Hypomobility, Impaired flexibility, Impaired sensation, Improper body mechanics, Postural dysfunction, Pain  Visit Diagnosis: Unsteadiness on feet - Plan: PT plan of care cert/re-cert  Muscle weakness (generalized) - Plan: PT plan of care cert/re-cert  Abnormal posture - Plan: PT plan of care cert/re-cert      G-Codes - [id number]    Functional Assessment Tool Used 10 meter walk, 5 times sit<>Stand, clinical judgement   Functional Limitation Mobility: Walking and moving around   Mobility: Walking and Moving Around Current Status [id number]) At least 40 percent but less than 60 percent impaired, limited or restricted   Mobility: Walking and Moving Around Goal Status [phone]) At least 20 percent but less than 40 percent impaired, limited or restricted       Problem List There are no active problems to display for this patient.  Tilman Neat, SPT This entire session was performed under direct supervision and direction of a licensed therapist/therapist assistant . I have personally read, edited and approve of the note as written.  Trotter,Margaret PT, DPT 11/15/2015, 3:39 PM   [place] MAIN [facility] SERVICES [address] [place], [place], [address] Phone: [phone]   Fax:  [phone]  Name: Gavin Beltran. MRN: [id number] Date of Birth: May 27, 1923

## 2015-11-16 ENCOUNTER — Encounter: Payer: Medicare Other | Admitting: Speech Pathology

## 2015-11-18 ENCOUNTER — Ambulatory Visit: Payer: Medicare Other | Admitting: Physical Therapy

## 2015-11-18 ENCOUNTER — Encounter: Payer: Self-pay | Admitting: Physical Therapy

## 2015-11-18 DIAGNOSIS — R293 Abnormal posture: Secondary | ICD-10-CM

## 2015-11-18 DIAGNOSIS — R490 Dysphonia: Secondary | ICD-10-CM | POA: Diagnosis not present

## 2015-11-18 DIAGNOSIS — R2681 Unsteadiness on feet: Secondary | ICD-10-CM

## 2015-11-18 DIAGNOSIS — M6281 Muscle weakness (generalized): Secondary | ICD-10-CM

## 2015-11-18 NOTE — Patient Instructions (Signed)
Retraction: Scapula - Bilateral    Facing pulley, straps around shoulders, pull straps back by pinching shoulder blades together. Repeat __10__ times per set. Do _2___ sets per session. Do __5__ sessions per week.  Copyright  VHI. All rights reserved.

## 2015-11-18 NOTE — Therapy (Signed)
[place] MAIN [facility] SERVICES [address] [place], [place], [address] Phone: [phone]   Fax:  [phone]  Physical Therapy Treatment  Patient Details  Name: Gavin Beltran. MRN: [id number] Date of Birth: 09-Sep-1923 Referring Provider: Dr. Manuella Ghazi  Encounter Date: 11/18/2015      PT End of Session - 11/18/15 1441    Visit Number 2   Number of Visits 17   Date for PT Re-Evaluation 01/10/16   Authorization Type G Code 2   Authorization Time Period 10   PT Start Time 1301   PT Stop Time 1347   PT Time Calculation (min) 46 min   Equipment Utilized During Treatment Gait belt   Activity Tolerance Patient tolerated treatment well   Behavior During Therapy WFL for tasks assessed/performed      Past Medical History  Diagnosis Date  . Parkinson disease ([place])   . Hypertension     controlled by meds  . Atrial fibrillation ([place])     controlled    History reviewed. No pertinent past surgical history.  There were no vitals filed for this visit.      Subjective Assessment - 11/18/15 1309    Subjective Patient reports having a fall yesterday afternoon when he was stepping backwards to avoid his grandsons and stepped off of a curb. Patient has mild abrasians on his L elbow, R lower trunk, and R upper trunk per patient's wife.  Patient reports being able to do HEP at home.    Patient is accompained by: Family member   Pertinent History Factors affecting Rehab: age, progressive disease, family support, limited activity level   Limitations Walking   How long can you sit comfortably? N/A   How long can you stand comfortably? patient states he is uncertain how to answer   How long can you walk comfortably? 120 feet   Patient Stated Goals improve balance because he has difficulty turning quickly    Currently in Pain? No/denies            [facility] PT Assessment - 11/18/15 0001    Berg Balance Test   Sit to Stand Able to stand without using  hands and stabilize independently   Standing Unsupported Able to stand safely 2 minutes   Sitting with Back Unsupported but Feet Supported on Floor or Stool Able to sit safely and securely 2 minutes   Stand to Sit Sits safely with minimal use of hands   Transfers Able to transfer safely, definite need of hands   Standing Unsupported with Eyes Closed Able to stand 10 seconds safely   Standing Ubsupported with Feet Together Able to place feet together independently and stand for 1 minute with supervision   From Standing, Reach Forward with Outstretched Arm Can reach forward >12 cm safely (5")   From Standing Position, Pick up Object from Floor Able to pick up shoe, needs supervision   From Standing Position, Turn to Look Behind Over each Shoulder Looks behind one side only/other side shows less weight shift   Turn 360 Degrees Needs assistance while turning   Standing Unsupported, Alternately Place Feet on Step/Stool Able to stand independently and complete 8 steps >20 seconds   Standing Unsupported, One Foot in Front Needs help to step but can hold 15 seconds   Standing on One Leg Unable to try or needs assist to prevent fall   Total Score 39   Berg comment: <45/56 indicates increased falls risk  Treatment:  Berg balance assessment performed.   Seated scapular retractions, 2 x 10, mod VCs to perform with good upright posture and increase scapular retraction with decreased upper trap contraction.  Re-educated and progressed seated PWR: Seated posture: 2 x 10, VCs to maintain upright and decrease performance of trunk extension. Seated rock: 2 x 10, VCs to increase weight shift, reach arm greater towards the sky, and extend LE. Seated twist: 2 x10, VCs to maintain opposite UE outwards and increase trunk rotation to clap hands. Seated step: 2 x10, mod VCs to increase step height instead of sliding feet in and out.   Patient required supervision-min assist during Berg balance in order to  maintain safety.                            PT Education - 11/18/15 1440    Education provided Yes   Education Details HEP progressed, safety   Person(s) Educated Patient   Methods Explanation;Tactile cues;Verbal cues;Demonstration   Comprehension Verbal cues required;Verbalized understanding;Returned demonstration             PT Long Term Goals - 11/15/15 1415    PT LONG TERM GOAL #1   Title Patient will be independent in HEP in order to increase patient's ability to maintain gains achieved in therapy and assist with return to PLOF.    Time 8   Period Weeks   Status New   PT LONG TERM GOAL #2   Title Patient will increase overall strength to 4/5 in BLE to increase ROM/strength throughout functional activities performed in his ADLs.    Time 8   Period Weeks   Status New   PT LONG TERM GOAL #3   Title Patient will be able to independently ascend/descend 5 steps with R unilateral hand rail in order to allow for safe entrance/exit from his home.    Time 8   Period Weeks   Status New   PT LONG TERM GOAL #4   Title Patient will decrease the time it takes to perform 5 times sit to stand to <14 seconds without UE use in order to decrease his risk for falls.     Time 8   Period Weeks   Status New   PT LONG TERM GOAL #5   Title Patient will increase his 10 m walk test time to >1.0 m/s in order to be a community ambulator at decreased risk for falls.    Time 8   Period Weeks   Status New   Additional Long Term Goals   Additional Long Term Goals Yes   PT LONG TERM GOAL #6   Title Patient's Berg balance score will improve by 7 points in order to decrease patient's risk for falls and increase safety in his home.   Time 8   Period Weeks   Status New               Plan - 11/18/15 1442    Clinical Impression Statement Patient's balance was assessed with Merrilee Jansky Balance test, patient has impairments especially in 360 degrees turns, single leg stance, and  tandem stance. Patient requires supervision through most testing and min assist during more difficulty activities as listed before. Patient instructed to perform scapular retraction at home, requires min-mod verbal and tactile cueing.  Patient requires min assist to perform seated PWR appropriately including progressed exercises to PWR twists and steps. Patient has appropriate hip extension and slumped deficits  come from thoracic spine. Patient appears to have mild scoliosis with R convexity in thoracic of 5 degrees, and L convexity in lumbar of 4 degrees. Patient would continue to benefit from skilled PT in order to address balance, postural dysfunction, safety, and increase functional safety.    Rehab Potential Good   Clinical Impairments Affecting Rehab Potential Postive Factors: motivated, family support; Negative Factors: degenerative disease, age, chronic issues Clinical Presenstation: Evolving-falls risk, progressive disease   PT Frequency 2x / week   PT Duration 8 weeks   PT Treatment/Interventions ADLs/Self Care Home Management;Aquatic Therapy;Biofeedback;Cryotherapy;Electrical Stimulation;Moist Heat;Traction;DME Instruction;Gait training;Stair training;Functional mobility training;Therapeutic activities;Therapeutic exercise;Balance training;Neuromuscular re-education;Patient/family education;Manual techniques;Passive range of motion;Energy conservation   PT Next Visit Plan assess low back, balance assessment Merrilee Jansky)   PT Home Exercise Plan HEP initiated    Consulted and Agree with Plan of Care Patient;Family member/caregiver   Family Member Consulted wife      Patient will benefit from skilled therapeutic intervention in order to improve the following deficits and impairments:  Decreased activity tolerance, Decreased balance, Decreased coordination, Decreased endurance, Decreased mobility, Decreased range of motion, Decreased safety awareness, Decreased strength, Difficulty walking,  Hypomobility, Impaired flexibility, Impaired sensation, Improper body mechanics, Postural dysfunction, Pain  Visit Diagnosis: Unsteadiness on feet  Muscle weakness (generalized)  Abnormal posture     Problem List There are no active problems to display for this patient.  Tilman Neat, SPT This entire session was performed under direct supervision and direction of a licensed therapist/therapist assistant . I have personally read, edited and approve of the note as written.  Trotter,Margaret PT, DPT 11/18/2015, 3:05 PM  [place] MAIN [facility] SERVICES [address] [place], [place], [address] Phone: [phone]   Fax:  [phone]  Name: Sakai Leazer. MRN: [id number] Date of Birth: 01-11-1924

## 2015-11-19 ENCOUNTER — Encounter: Payer: Medicare Other | Admitting: Speech Pathology

## 2015-11-23 ENCOUNTER — Encounter: Payer: Medicare Other | Admitting: Speech Pathology

## 2015-11-23 ENCOUNTER — Encounter: Payer: Self-pay | Admitting: Physical Therapy

## 2015-11-23 ENCOUNTER — Ambulatory Visit: Payer: Medicare Other | Admitting: Physical Therapy

## 2015-11-23 DIAGNOSIS — R2681 Unsteadiness on feet: Secondary | ICD-10-CM

## 2015-11-23 DIAGNOSIS — R490 Dysphonia: Secondary | ICD-10-CM | POA: Diagnosis not present

## 2015-11-23 DIAGNOSIS — R293 Abnormal posture: Secondary | ICD-10-CM

## 2015-11-23 DIAGNOSIS — M6281 Muscle weakness (generalized): Secondary | ICD-10-CM

## 2015-11-23 NOTE — Therapy (Signed)
[place] MAIN [facility] SERVICES [address] [place], [place], [address] Phone: [phone]   Fax:  [phone]  Physical Therapy Treatment  Patient Details  Name: Gavin Beltran. MRN: [id number] Date of Birth: Nov 09, 1923 Referring Provider: Dr. Manuella Ghazi  Encounter Date: 11/23/2015      PT End of Session - 11/23/15 1549    Visit Number 23   Number of Visits 17   Date for PT Re-Evaluation 01/10/16   Authorization Type G Code 2   Authorization Time Period 10   PT Start Time 0335   PT Stop Time 0415   PT Time Calculation (min) 40 min   Equipment Utilized During Treatment Gait belt   Activity Tolerance Patient tolerated treatment well   Behavior During Therapy WFL for tasks assessed/performed      Past Medical History:  Diagnosis Date  . Atrial fibrillation ([place])    controlled  . Hypertension    controlled by meds  . Parkinson disease ([place])     History reviewed. No pertinent surgical history.  There were no vitals filed for this visit.      Subjective Assessment - 11/23/15 1542    Subjective Patient has back pain today that he reports is worse than before from the exercises from therapy last session.,    Patient is accompained by: Family member   Pertinent History Factors affecting Rehab: age, progressive disease, family support, limited activity level   Limitations Walking   How long can you sit comfortably? N/A   How long can you stand comfortably? patient states he is uncertain how to answer   How long can you walk comfortably? 120 feet   Patient Stated Goals improve balance because he has difficulty turning quickly    Currently in Pain? Yes   Pain Score 2       Therapeutic exercise: Seated scapula retraction with RTB x 10 x 2 Prone shoulder abd x 10 x 2 left and right sidelying hip abd leg exrension , sidelying hip abd with knee and hip flexed BLE sidelying fwd flex and extension x 10 x 2 left and right hooklying  abd/ER  With RTB x 20 x 2 hooklying marching with RTB around knees x 20 x 2 Leg press with 90 lbs x 15 x 2 Patient needs constant cuing to not snap his knees during knee extension on the leg press. He did not follow the instructions for rolling over to get up off the mat that were given to him and he got up the way that was not recommended from supine which is hard on his back . He has some fatigue and was not coordinated in his movements with the above exercise requiring verbal cuing.                             PT Education - 11/23/15 1549    Education provided Yes   Education Details HEP progressed   Person(s) Educated Patient   Methods Explanation   Comprehension Verbalized understanding             PT Long Term Goals - 11/15/15 1415      PT LONG TERM GOAL #1   Title Patient will be independent in HEP in order to increase patient's ability to maintain gains achieved in therapy and assist with return to PLOF.    Time 8   Period Weeks   Status New  PT LONG TERM GOAL #2   Title Patient will increase overall strength to 4/5 in BLE to increase ROM/strength throughout functional activities performed in his ADLs.    Time 8   Period Weeks   Status New     PT LONG TERM GOAL #3   Title Patient will be able to independently ascend/descend 5 steps with R unilateral hand rail in order to allow for safe entrance/exit from his home.    Time 8   Period Weeks   Status New     PT LONG TERM GOAL #4   Title Patient will decrease the time it takes to perform 5 times sit to stand to <14 seconds without UE use in order to decrease his risk for falls.     Time 8   Period Weeks   Status New     PT LONG TERM GOAL #5   Title Patient will increase his 10 m walk test time to >1.0 m/s in order to be a community ambulator at decreased risk for falls.    Time 8   Period Weeks   Status New     Additional Long Term Goals   Additional Long Term Goals Yes     PT LONG  TERM GOAL #6   Title Patient's Berg balance score will improve by 7 points in order to decrease patient's risk for falls and increase safety in his home.   Time 8   Period Weeks   Status New               Plan - 11/23/15 1550    Clinical Impression Statement Patient has increased back pain today . He performed LE exercises in prone, sidelying and supine and thoracic/scapula exercises in prone and seated. He has no reports of increased pain but does report fatigue.    Rehab Potential Good   Clinical Impairments Affecting Rehab Potential Postive Factors: motivated, family support; Negative Factors: degenerative disease, age, chronic issues Clinical Presenstation: Evolving-falls risk, progressive disease   PT Frequency 2x / week   PT Duration 8 weeks   PT Treatment/Interventions ADLs/Self Care Home Management;Aquatic Therapy;Biofeedback;Cryotherapy;Electrical Stimulation;Moist Heat;Traction;DME Instruction;Gait training;Stair training;Functional mobility training;Therapeutic activities;Therapeutic exercise;Balance training;Neuromuscular re-education;Patient/family education;Manual techniques;Passive range of motion;Energy conservation   PT Next Visit Plan assess low back, balance assessment Merrilee Jansky)   PT Home Exercise Plan HEP initiated    Consulted and Agree with Plan of Care Patient;Family member/caregiver   Family Member Consulted wife      Patient will benefit from skilled therapeutic intervention in order to improve the following deficits and impairments:  Decreased activity tolerance, Decreased balance, Decreased coordination, Decreased endurance, Decreased mobility, Decreased range of motion, Decreased safety awareness, Decreased strength, Difficulty walking, Hypomobility, Impaired flexibility, Impaired sensation, Improper body mechanics, Postural dysfunction, Pain  Visit Diagnosis: Unsteadiness on feet  Muscle weakness (generalized)  Abnormal posture     Problem  List There are no active problems to display for this patient.  Alanson Puls, PT, DPT [place], Minette [place] S 11/23/2015, 3:54 PM  [place] MAIN [facility] SERVICES [address] [place], [place], [address] Phone: [phone]   Fax:  [phone]  Name: Dwan Coady. MRN: [id number] Date of Birth: 09-Dec-1923

## 2015-11-25 ENCOUNTER — Encounter: Payer: Medicare Other | Admitting: Speech Pathology

## 2015-11-26 ENCOUNTER — Encounter: Payer: Self-pay | Admitting: Physical Therapy

## 2015-11-26 ENCOUNTER — Ambulatory Visit: Payer: Medicare Other | Admitting: Physical Therapy

## 2015-11-26 DIAGNOSIS — R490 Dysphonia: Secondary | ICD-10-CM | POA: Diagnosis not present

## 2015-11-26 DIAGNOSIS — R293 Abnormal posture: Secondary | ICD-10-CM

## 2015-11-26 DIAGNOSIS — R2681 Unsteadiness on feet: Secondary | ICD-10-CM

## 2015-11-26 DIAGNOSIS — M6281 Muscle weakness (generalized): Secondary | ICD-10-CM

## 2015-11-26 NOTE — Therapy (Signed)
[place] MAIN [facility] SERVICES [address] [place], [place], [address] Phone: [phone]   Fax:  [phone]  Physical Therapy Treatment  Patient Details  Name: Gavin Beltran. MRN: [id number] Date of Birth: 1924/02/24 Referring Provider: Dr. Manuella Ghazi  Encounter Date: 11/26/2015      PT End of Session - 11/26/15 1544    Visit Number 4   Number of Visits 17   Date for PT Re-Evaluation 01/10/16   Authorization Type G Code 2   Authorization Time Period 10   PT Start Time 1516   PT Stop Time 1603   PT Time Calculation (min) 47 min   Equipment Utilized During Treatment Gait belt   Activity Tolerance Patient tolerated treatment well   Behavior During Therapy WFL for tasks assessed/performed      Past Medical History:  Diagnosis Date  . Atrial fibrillation ([place])    controlled  . Hypertension    controlled by meds  . Parkinson disease ([place])     History reviewed. No pertinent surgical history.  There were no vitals filed for this visit.      Subjective Assessment - 11/26/15 1539    Subjective Pt reports he is doing well and has had no recent falls. He is doing his HEP at home.   Patient is accompained by: Family member   Pertinent History Factors affecting Rehab: age, progressive disease, family support, limited activity level   Limitations Walking   How long can you sit comfortably? N/A   How long can you stand comfortably? patient states he is uncertain how to answer   How long can you walk comfortably? 120 feet   Patient Stated Goals improve balance because he has difficulty turning quickly    Currently in Pain? No/denies     Nustep L2 x5 minutes warm up (unbilled)  Neuro re-education:  Seated posture: 2 x 10 Seated rock: 2 x 10 Seated twist: 2 x10 Seated step: 2 x10  Standing forward lunge with 1 UE support Standing side lunge with 1 UE support Standing march with 1 UE support  Mod verbal and visual cues for  correct technique and BIG movements of exercises to increase desired therapeutic effect and improve mobility. Also frequent cues for postural correction.  Gait:  Ambulation x 200 ft x2 with SPC and CGA with HT and full turns.   Mod cues for heel to toe pattern, postural correction and increased BOS to improve gait mechanics and stability.                            PT Education - 11/26/15 1541    Education provided Yes   Education Details continue HEP, BIG movements   Person(s) Educated Patient   Methods Explanation;Demonstration   Comprehension Verbalized understanding;Returned demonstration             PT Long Term Goals - 11/15/15 1415      PT LONG TERM GOAL #1   Title Patient will be independent in HEP in order to increase patient's ability to maintain gains achieved in therapy and assist with return to PLOF.    Time 8   Period Weeks   Status New     PT LONG TERM GOAL #2   Title Patient will increase overall strength to 4/5 in BLE to increase ROM/strength throughout functional activities performed in his ADLs.    Time 8   Period Weeks   Status  New     PT LONG TERM GOAL #3   Title Patient will be able to independently ascend/descend 5 steps with R unilateral hand rail in order to allow for safe entrance/exit from his home.    Time 8   Period Weeks   Status New     PT LONG TERM GOAL #4   Title Patient will decrease the time it takes to perform 5 times sit to stand to <14 seconds without UE use in order to decrease his risk for falls.     Time 8   Period Weeks   Status New     PT LONG TERM GOAL #5   Title Patient will increase his 10 m walk test time to >1.0 m/s in order to be a community ambulator at decreased risk for falls.    Time 8   Period Weeks   Status New     Additional Long Term Goals   Additional Long Term Goals Yes     PT LONG TERM GOAL #6   Title Patient's Berg balance score will improve by 7 points in order to decrease  patient's risk for falls and increase safety in his home.   Time 8   Period Weeks   Status New               Plan - 11/26/15 1606    Clinical Impression Statement Pt was able to tolerate seated and new standing exercises this session incoorporating BIG movements. During dynamic gait training pt tended to shuffle his feet. With cues for heel to toe pattern and postural correction, pt was able to improve gait mechanics and stability. He will benefit from continued therapy to further progress towards funcitonal goals.   Rehab Potential Good   Clinical Impairments Affecting Rehab Potential Postive Factors: motivated, family support; Negative Factors: degenerative disease, age, chronic issues Clinical Presenstation: Evolving-falls risk, progressive disease   PT Frequency 2x / week   PT Duration 8 weeks   PT Treatment/Interventions ADLs/Self Care Home Management;Aquatic Therapy;Biofeedback;Cryotherapy;Electrical Stimulation;Moist Heat;Traction;DME Instruction;Gait training;Stair training;Functional mobility training;Therapeutic activities;Therapeutic exercise;Balance training;Neuromuscular re-education;Patient/family education;Manual techniques;Passive range of motion;Energy conservation   PT Next Visit Plan assess low back, balance assessment Merrilee Jansky)   PT Home Exercise Plan HEP initiated    Consulted and Agree with Plan of Care Patient;Family member/caregiver   Family Member Consulted wife      Patient will benefit from skilled therapeutic intervention in order to improve the following deficits and impairments:  Decreased activity tolerance, Decreased balance, Decreased coordination, Decreased endurance, Decreased mobility, Decreased range of motion, Decreased safety awareness, Decreased strength, Difficulty walking, Hypomobility, Impaired flexibility, Impaired sensation, Improper body mechanics, Postural dysfunction, Pain  Visit Diagnosis: Unsteadiness on feet  Muscle weakness  (generalized)  Abnormal posture     Problem List There are no active problems to display for this patient.   Anayely Constantine Shiela Mayer, PT, DPT  11/26/15, 4:12 PM [place] MAIN [facility] SERVICES [address] [place], [place], [address] Phone: [phone]   Fax:  [phone]  Name: Gavin Beltran. MRN: [id number] Date of Birth: 04-14-1924

## 2015-11-29 ENCOUNTER — Encounter: Payer: Self-pay | Admitting: Physical Therapy

## 2015-11-29 ENCOUNTER — Ambulatory Visit: Payer: Medicare Other | Admitting: Physical Therapy

## 2015-11-29 DIAGNOSIS — R293 Abnormal posture: Secondary | ICD-10-CM

## 2015-11-29 DIAGNOSIS — R490 Dysphonia: Secondary | ICD-10-CM | POA: Diagnosis not present

## 2015-11-29 DIAGNOSIS — R2681 Unsteadiness on feet: Secondary | ICD-10-CM

## 2015-11-29 DIAGNOSIS — M6281 Muscle weakness (generalized): Secondary | ICD-10-CM

## 2015-11-29 NOTE — Patient Instructions (Addendum)
   Copyright  VHI. All rights reserved.  Sit to Stand (Sitting)    Sit on ball. Tighten pelvic floor and hold. Lean forward. Stand up. Repeat _10__ times. Do _2__ times a day.  Copyright  VHI. All rights reserved.

## 2015-11-29 NOTE — Therapy (Signed)
[place] MAIN [facility] SERVICES [address] [place], [place], [address] Phone: [phone]   Fax:  [phone]  Physical Therapy Treatment  Patient Details  Name: Gavin Beltran. MRN: [id number] Date of Birth: [birth year] Referring Provider: Dr. Manuella Ghazi  Encounter Date: 11/29/2015      PT End of Session - 11/29/15 1645    Visit Number 5   Number of Visits 17   Date for PT Re-Evaluation 01/10/16   Authorization Type G Code 5   Authorization Time Period 10   PT Start Time 1448   PT Stop Time 1532   PT Time Calculation (min) 44 min   Equipment Utilized During Treatment Gait belt   Activity Tolerance Patient tolerated treatment well   Behavior During Therapy WFL for tasks assessed/performed      Past Medical History:  Diagnosis Date  . Atrial fibrillation ([place])    controlled  . Hypertension    controlled by meds  . Parkinson disease ([place])     History reviewed. No pertinent surgical history.  There were no vitals filed for this visit.      Subjective Assessment - 11/29/15 1451    Subjective Patient reports back pain upon waking up and improved when moving throughou the day. Patient reports not doing HEP this week, but that the exercises have been going okay. He thinks they may cause a little soreness after the exercises. No falls since last seen.    Patient is accompained by: Family member   Pertinent History Factors affecting Rehab: age, progressive disease, family support, limited activity level   Limitations Walking   How long can you sit comfortably? N/A   How long can you stand comfortably? patient states he is uncertain how to answer   How long can you walk comfortably? 120 feet   Patient Stated Goals improve balance because he has difficulty turning quickly    Currently in Pain? No/denies      Treatment:  Instructed in how to use lumbar roll in order to improve posture.  Initiated into HEP.   Seated PWR  exercises PWR up: x10, min VCs to not perform full trunk extension past neutral PWR weight shift: x10, min VCs to decrease range as patient notes pain  PWR twist: x10, min VCs to increase opening in middle PWR step: x10, min VCs to increase step height  Supine PWR exercises initiated.  PWR up: 2x10, mod VCs in order to perform exercises PWR weight shift: 2x10 min VCs to increase weight shift bilaterally PWR twist: 2x10 min VCs to use LE to assist with movement PWR step: 2x10 min VCs to decrease hip abduction range and increase step height  DME (SPC) instruction, 10 meter x4, SPC in RUE, instructed to perform SPC, L foot, R foot CGA throughout ambulation, Min VCs to increase speed to his normal range after practicing several times.    Sit to stand with airex pad on bottom of chair to increase height, x5 reps, no UE use, CGA and min VCs to increase eccentric control Initiated into HEP, instructed to put several pillows down if need higher chair in order to not use hands safely.                              PT Education - 11/29/15 1644    Education provided Yes   Education Details progressed HEP, discontinuing certain exercises if painful to low back, lumbar  roll   Person(s) Educated Patient   Methods Explanation;Verbal cues;Demonstration   Comprehension Verbal cues required;Verbalized understanding;Returned demonstration             PT Long Term Goals - 11/15/15 1415      PT LONG TERM GOAL #1   Title Patient will be independent in HEP in order to increase patient's ability to maintain gains achieved in therapy and assist with return to PLOF.    Time 8   Period Weeks   Status New     PT LONG TERM GOAL #2   Title Patient will increase overall strength to 4/5 in BLE to increase ROM/strength throughout functional activities performed in his ADLs.    Time 8   Period Weeks   Status New     PT LONG TERM GOAL #3   Title Patient will be able to  independently ascend/descend 5 steps with R unilateral hand rail in order to allow for safe entrance/exit from his home.    Time 8   Period Weeks   Status New     PT LONG TERM GOAL #4   Title Patient will decrease the time it takes to perform 5 times sit to stand to <14 seconds without UE use in order to decrease his risk for falls.     Time 8   Period Weeks   Status New     PT LONG TERM GOAL #5   Title Patient will increase his 10 m walk test time to >1.0 m/s in order to be a community ambulator at decreased risk for falls.    Time 8   Period Weeks   Status New     Additional Long Term Goals   Additional Long Term Goals Yes     PT LONG TERM GOAL #6   Title Patient's Berg balance score will improve by 7 points in order to decrease patient's risk for falls and increase safety in his home.   Time 8   Period Weeks   Status New               Plan - 11/29/15 1645    Clinical Impression Statement Patient has increased difficulty with seated power rocks due to slight back pain. Instructed on supine PWR moves with no increase in pain. Patient given min-mod VCs to improve DME (SPC) cane use in order to sequence during ambulation. Patient would continue to benefit from skilled PT in order to address SPC sequencing, improve posture, increase LE strengthening, and improved ADLs.    Rehab Potential Good   Clinical Impairments Affecting Rehab Potential Postive Factors: motivated, family support; Negative Factors: degenerative disease, age, chronic issues Clinical Presenstation: Evolving-falls risk, progressive disease   PT Frequency 2x / week   PT Duration 8 weeks   PT Treatment/Interventions ADLs/Self Care Home Management;Aquatic Therapy;Biofeedback;Cryotherapy;Electrical Stimulation;Moist Heat;Traction;DME Instruction;Gait training;Stair training;Functional mobility training;Therapeutic activities;Therapeutic exercise;Balance training;Neuromuscular re-education;Patient/family  education;Manual techniques;Passive range of motion;Energy conservation   PT Next Visit Plan address LBP prn, continue with PWR exercises, dynamic balance   PT Home Exercise Plan HEP progressed   Consulted and Agree with Plan of Care Patient;Family member/caregiver   Family Member Consulted wife      Patient will benefit from skilled therapeutic intervention in order to improve the following deficits and impairments:  Decreased activity tolerance, Decreased balance, Decreased coordination, Decreased endurance, Decreased mobility, Decreased range of motion, Decreased safety awareness, Decreased strength, Difficulty walking, Hypomobility, Impaired flexibility, Impaired sensation, Improper body mechanics, Postural dysfunction, Pain  Visit  Diagnosis: Unsteadiness on feet  Muscle weakness (generalized)  Abnormal posture     Problem List There are no active problems to display for this patient.  Tilman Neat, SPT This entire session was performed under direct supervision and direction of a licensed therapist/therapist assistant . I have personally read, edited and approve of the note as written.  Trotter,Margaret PT, DPT 11/30/2015, 8:27 AM  [place] MAIN [facility] SERVICES [address] [place], [place], [address] Phone: [phone]   Fax:  [phone]  Name: Graiden Claussen. MRN: [id number] Date of Birth: 1923-06-13

## 2015-11-30 ENCOUNTER — Encounter: Payer: Medicare Other | Admitting: Speech Pathology

## 2015-12-01 ENCOUNTER — Encounter: Payer: Self-pay | Admitting: Physical Therapy

## 2015-12-01 ENCOUNTER — Ambulatory Visit: Payer: Medicare Other | Attending: Neurology | Admitting: Physical Therapy

## 2015-12-01 DIAGNOSIS — R293 Abnormal posture: Secondary | ICD-10-CM

## 2015-12-01 DIAGNOSIS — R2681 Unsteadiness on feet: Secondary | ICD-10-CM

## 2015-12-01 DIAGNOSIS — M6281 Muscle weakness (generalized): Secondary | ICD-10-CM | POA: Diagnosis present

## 2015-12-01 NOTE — Therapy (Signed)
[place] MAIN [facility] SERVICES [address] [place], [place], [address] Phone: [phone]   Fax:  [phone]  Physical Therapy Treatment  Patient Details  Name: Gavin Beltran. MRN: [id number] Date of Birth: 1923/09/03 Referring Provider: Dr. Manuella Ghazi  Encounter Date: 12/01/2015      PT End of Session - 12/01/15 1709    Visit Number 6   Number of Visits 17   Date for PT Re-Evaluation 01/10/16   Authorization Type G Code 6   Authorization Time Period 10   PT Start Time 1445   PT Stop Time 1530   PT Time Calculation (min) 45 min   Equipment Utilized During Treatment Gait belt   Activity Tolerance Patient tolerated treatment well;Patient limited by fatigue   Behavior During Therapy WFL for tasks assessed/performed      Past Medical History:  Diagnosis Date  . Atrial fibrillation ([place])    controlled  . Hypertension    controlled by meds  . Parkinson disease ([place])     History reviewed. No pertinent surgical history.  There were no vitals filed for this visit.      Subjective Assessment - 12/01/15 1449    Subjective Patient states he is doing well but that he feels like he can't straighten up today. He reports he has not had any falls and did his HEP this morning. He stated he was extremely tired after exercises.    Patient is accompained by: Family member   Pertinent History Factors affecting Rehab: age, progressive disease, family support, limited activity level   Limitations Walking   How long can you sit comfortably? N/A   How long can you stand comfortably? patient states he is uncertain how to answer   How long can you walk comfortably? 120 feet   Patient Stated Goals improve balance because he has difficulty turning quickly    Currently in Pain? No/denies     Treatment:  NuStep, L3 BUE/LE, x3 min. Min VCs to perform at a steady pace (unbilled).   Low row extensions, red tband resistance, 2 x10, Scapular retractions,  red tband resistance, 2 x10 Min VCs for appropriate technique and improved scapular strengthening.   Instructed to lay prone 1-2 minutes, was going to move into prone scapular exercises but patient was uncomfortable and his face was turning red.    Sidelying on L, quad/hip flexor stretching on RLE 2x20 seconds  Sit to stands x5 with no UE support, min VCs to control eccentrically; x5 with 1.5# bar weight, lower table height, min VCs to decrease UE range due to poor posture.  Instructed on how to perform bed transfers, x3, min assist to move RLE onto bed due to R sided LBP.  Patient required demonstration, min-mod VCs, and tactile cueing to perform correctly.   Supine PWR exercises: Power Up: 2x10, min VCs to perform through a lesser range in order to decrease breath holding during activity.  Power Rock: x10, min VCs to increase weight shift Power Twist: x10, min VCs to increase trunk rotation Power Step: 2x10, min VCs on how to perform the activity initially                            PT Education - 12/01/15 1706    Education provided Yes   Education Details posture, getting into/out of bed with improved body mechanics    Person(s) Educated Patient;Spouse   Methods Explanation;Demonstration;Verbal cues;Tactile cues  Comprehension Returned demonstration;Verbalized understanding;Verbal cues required;Tactile cues required             PT Long Term Goals - 11/15/15 1415      PT LONG TERM GOAL #1   Title Patient will be independent in HEP in order to increase patient's ability to maintain gains achieved in therapy and assist with return to PLOF.    Time 8   Period Weeks   Status New     PT LONG TERM GOAL #2   Title Patient will increase overall strength to 4/5 in BLE to increase ROM/strength throughout functional activities performed in his ADLs.    Time 8   Period Weeks   Status New     PT LONG TERM GOAL #3   Title Patient will be able to  independently ascend/descend 5 steps with R unilateral hand rail in order to allow for safe entrance/exit from his home.    Time 8   Period Weeks   Status New     PT LONG TERM GOAL #4   Title Patient will decrease the time it takes to perform 5 times sit to stand to <14 seconds without UE use in order to decrease his risk for falls.     Time 8   Period Weeks   Status New     PT LONG TERM GOAL #5   Title Patient will increase his 10 m walk test time to >1.0 m/s in order to be a community ambulator at decreased risk for falls.    Time 8   Period Weeks   Status New     Additional Long Term Goals   Additional Long Term Goals Yes     PT LONG TERM GOAL #6   Title Patient's Berg balance score will improve by 7 points in order to decrease patient's risk for falls and increase safety in his home.   Time 8   Period Weeks   Status New               Plan - 12/01/15 1710    Clinical Impression Statement Patient instructed on supine PWR exercises again with better technique, but he continues to require min verbal/tactile cueing. Patient instructed to lay prone in order to increase spinal movement, able to lay 1-2 minutes then his face began to get red, and he was uncomfortable so he was instructed to sit up. HE was able to perform exercises with min-mod VCs. Instructed on how to perform getting into/out of bed with improved form. Patient would continue to benefit from skilled PT in order to address posture, strengthening, safety with ambulation, and improve ADLs.    Rehab Potential Good   Clinical Impairments Affecting Rehab Potential Postive Factors: motivated, family support; Negative Factors: degenerative disease, age, chronic issues Clinical Presenstation: Evolving-falls risk, progressive disease   PT Frequency 2x / week   PT Duration 8 weeks   PT Treatment/Interventions ADLs/Self Care Home Management;Aquatic Therapy;Biofeedback;Cryotherapy;Electrical Stimulation;Moist  Heat;Traction;DME Instruction;Gait training;Stair training;Functional mobility training;Therapeutic activities;Therapeutic exercise;Balance training;Neuromuscular re-education;Patient/family education;Manual techniques;Passive range of motion;Energy conservation   PT Next Visit Plan address LBP prn, continue with PWR exercises, dynamic balance, postural muscles    PT Home Exercise Plan continue as given    Consulted and Agree with Plan of Care Patient;Family member/caregiver   Family Member Consulted wife      Patient will benefit from skilled therapeutic intervention in order to improve the following deficits and impairments:  Decreased activity tolerance, Decreased balance, Decreased coordination, Decreased endurance,  Decreased mobility, Decreased range of motion, Decreased safety awareness, Decreased strength, Difficulty walking, Hypomobility, Impaired flexibility, Impaired sensation, Improper body mechanics, Postural dysfunction, Pain  Visit Diagnosis: Unsteadiness on feet  Muscle weakness (generalized)  Abnormal posture     Problem List There are no active problems to display for this patient.  This entire session was performed under direct supervision and direction of a licensed therapist/therapist assistant . I have personally read, edited and approve of the note as written.   Phillips Grout PT, DPT   Tilman Neat, SPT Tilman Neat 12/01/2015, 5:14 PM  [place] MAIN [facility] SERVICES [address] [place], [place], [address] Phone: [phone]   Fax:  [phone]  Name: Gavin Beltran. MRN: [id number] Date of Birth: 10/21/1923

## 2015-12-02 ENCOUNTER — Encounter: Payer: Medicare Other | Admitting: Speech Pathology

## 2015-12-07 ENCOUNTER — Encounter: Payer: Medicare Other | Admitting: Speech Pathology

## 2015-12-08 ENCOUNTER — Encounter: Payer: Self-pay | Admitting: Physical Therapy

## 2015-12-08 ENCOUNTER — Ambulatory Visit: Payer: Medicare Other | Admitting: Physical Therapy

## 2015-12-08 DIAGNOSIS — R2681 Unsteadiness on feet: Secondary | ICD-10-CM

## 2015-12-08 DIAGNOSIS — M6281 Muscle weakness (generalized): Secondary | ICD-10-CM

## 2015-12-08 DIAGNOSIS — R293 Abnormal posture: Secondary | ICD-10-CM

## 2015-12-08 NOTE — Therapy (Signed)
[place] MAIN [facility] SERVICES [address] [place], [place], [address] Phone: [phone]   Fax:  [phone]  Physical Therapy Treatment  Patient Details  Name: Gavin Beltran. MRN: [id number] Date of Birth: [birth year] Referring Provider: Dr. Manuella Ghazi  Encounter Date: 12/08/2015      PT End of Session - 12/08/15 1336    Visit Number 7   Number of Visits 17   Date for PT Re-Evaluation 01/10/16   Authorization Type G Code 7   Authorization Time Period 10   PT Start Time 1102   PT Stop Time 1148   PT Time Calculation (min) 46 min   Equipment Utilized During Treatment Gait belt   Activity Tolerance Patient tolerated treatment well;Patient limited by fatigue   Behavior During Therapy WFL for tasks assessed/performed      Past Medical History:  Diagnosis Date  . Atrial fibrillation ([place])    controlled  . Hypertension    controlled by meds  . Parkinson disease ([place])     History reviewed. No pertinent surgical history.  There were no vitals filed for this visit.      Subjective Assessment - 12/08/15 1105    Subjective Patient states his back has been causing him more pain especially when getting up this morning. He reports he had one fall since last PT session while he was attempting to turn quickly. Patient reports he hit his head when he fell. Patient does have small bruise on posterior aspect of his head. He and his wife denied any of the following both at the time of injury and now including dizziness, nausea, HA, visual changes, vomitting, disturbances in sleep, personality changes, or agitation. Patient and wife instructed if any symptoms begin to occur to take him to the ER immediately.   Patient is accompained by: Family member   Pertinent History Factors affecting Rehab: age, progressive disease, family support, limited activity level   Limitations Walking   How long can you sit comfortably? N/A   How long can you stand  comfortably? patient states he is uncertain how to answer   How long can you walk comfortably? 120 feet   Patient Stated Goals improve balance because he has difficulty turning quickly    Currently in Pain? Yes   Pain Score 1    Pain Location Back   Pain Orientation Right      Treatment: Prior to bed mobility: Gait Training: Instructed to ambulate with double platform walker, 2 x100 feet, CGA, min VCs to decrease forward trunk flexion and put head in neutral position.  Static standing in platform walker, CGA, 2x30 seconds, min VCs to improve posture and maintain Patient was able to obtain a more upright posture; however patient still maintains mild trunk flexion both forward and to the R. Patient is unable to maintain head in a neutral position; His head stays slight forward flexed and off the the right even with improved posture. With the platform walker, patient showed improved stability with decreased shifts from midline, and he requires less assistance from SPT.   Patient had improved posture with SPC upon leaving even with limited training on the platform walker.  Prior to bed mobility: TherEx: Standing hip extension standing with support from platform walker (1 set), and hi-lo table (1set), x10 each LE each set. Min VCs to decrease forward trunk lean and maintain posture  Sit to stands x5, multiple attempts, no HHA, min assist for safety  Seated shoulder retractions and  extensions 2x10, red tband resistance, min VCs to correct form and to increase scapular movement. Following gait training and therapeutic exercise: Functional Activity: Due to patient reporting increase LBP with bed mobility, demonstrated proper bed mobility with log roll technique to decrease stress on low back.  When performing towards the L, large increase in pain. Patient requires min-mod VCs to decrease trunk twisting when moving from sitting<>laying and to move into full side lying instead of moving half  side lying and half supine to lay down. Instructed to perform to opposite side (R), improved back pain with mild increase and improved ability to perform. Requires min assist with LE and VCs about how to perform appropriately.                              PT Education - 12/08/15 1335    Education provided Yes   Education Details posture, bed mobility, changes in status to report to ER   Person(s) Educated Patient   Methods Explanation;Demonstration;Tactile cues;Verbal cues   Comprehension Verbalized understanding;Returned demonstration;Verbal cues required;Tactile cues required             PT Long Term Goals - 11/15/15 1415      PT LONG TERM GOAL #1   Title Patient will be independent in HEP in order to increase patient's ability to maintain gains achieved in therapy and assist with return to PLOF.    Time 8   Period Weeks   Status New     PT LONG TERM GOAL #2   Title Patient will increase overall strength to 4/5 in BLE to increase ROM/strength throughout functional activities performed in his ADLs.    Time 8   Period Weeks   Status New     PT LONG TERM GOAL #3   Title Patient will be able to independently ascend/descend 5 steps with R unilateral hand rail in order to allow for safe entrance/exit from his home.    Time 8   Period Weeks   Status New     PT LONG TERM GOAL #4   Title Patient will decrease the time it takes to perform 5 times sit to stand to <14 seconds without UE use in order to decrease his risk for falls.     Time 8   Period Weeks   Status New     PT LONG TERM GOAL #5   Title Patient will increase his 10 m walk test time to >1.0 m/s in order to be a community ambulator at decreased risk for falls.    Time 8   Period Weeks   Status New     Additional Long Term Goals   Additional Long Term Goals Yes     PT LONG TERM GOAL #6   Title Patient's Berg balance score will improve by 7 points in order to decrease patient's risk for  falls and increase safety in his home.   Time 8   Period Weeks   Status New               Plan - 12/08/15 1336    Clinical Impression Statement Patient was able to participate in PT today with mild increase in R sided LBP with initial standing and during bed mobility. Patient did have a fall that resulted in him bumping his head since last PT session. He and his wife denies all symptoms indicating a worse injury. Patient and wife instructed to see  a MD immediately if symptoms begin occurring. Throughout therapy session, patient denies symptoms and states he feels fine whenever asked. Patient has improved bed mobility with decreased back pain when laying towards R side. Patient responds well with platform walker ambulation training with improved upright posture after treatment session. Patient would benefit from skilled PT to decrease falls, increase posture, and improve safety in ADLs.    Rehab Potential Good   Clinical Impairments Affecting Rehab Potential Postive Factors: motivated, family support; Negative Factors: degenerative disease, age, chronic issues Clinical Presenstation: Evolving-falls risk, progressive disease   PT Frequency 2x / week   PT Duration 8 weeks   PT Treatment/Interventions ADLs/Self Care Home Management;Aquatic Therapy;Biofeedback;Cryotherapy;Electrical Stimulation;Moist Heat;Traction;DME Instruction;Gait training;Stair training;Functional mobility training;Therapeutic activities;Therapeutic exercise;Balance training;Neuromuscular re-education;Patient/family education;Manual techniques;Passive range of motion;Energy conservation   PT Next Visit Plan address LBP prn, continue with PWR exercises, dynamic balance, postural muscles    PT Home Exercise Plan continue as given    Consulted and Agree with Plan of Care Patient;Family member/caregiver   Family Member Consulted wife      Patient will benefit from skilled therapeutic intervention in order to improve the  following deficits and impairments:  Decreased activity tolerance, Decreased balance, Decreased coordination, Decreased endurance, Decreased mobility, Decreased range of motion, Decreased safety awareness, Decreased strength, Difficulty walking, Hypomobility, Impaired flexibility, Impaired sensation, Improper body mechanics, Postural dysfunction, Pain  Visit Diagnosis: Unsteadiness on feet  Muscle weakness (generalized)  Abnormal posture     Problem List There are no active problems to display for this patient.  Tilman Neat, SPT This entire session was performed under direct supervision and direction of a licensed therapist/therapist assistant . I have personally read, edited and approve of the note as written.  Trotter,Margaret PT, DPT 12/08/2015, 4:31 PM  [place] MAIN [facility] SERVICES [address] [place], [place], [address] Phone: [phone]   Fax:  [phone]  Name: Gavin Beltran. MRN: [id number] Date of Birth: 03-28-1924

## 2015-12-09 ENCOUNTER — Encounter: Payer: Medicare Other | Admitting: Speech Pathology

## 2015-12-10 ENCOUNTER — Ambulatory Visit: Payer: Medicare Other

## 2015-12-10 ENCOUNTER — Encounter: Payer: Self-pay | Admitting: Physical Therapy

## 2015-12-10 DIAGNOSIS — M6281 Muscle weakness (generalized): Secondary | ICD-10-CM

## 2015-12-10 DIAGNOSIS — R2681 Unsteadiness on feet: Secondary | ICD-10-CM

## 2015-12-10 DIAGNOSIS — R293 Abnormal posture: Secondary | ICD-10-CM

## 2015-12-10 NOTE — Therapy (Signed)
[place] MAIN [facility] SERVICES [address] [place], [place], [address] Phone: [phone]   Fax:  [phone]  Physical Therapy Treatment  Patient Details  Name: Gavin Beltran. MRN: [id number] Date of Birth: 04/14/1924 Referring Provider: Dr. Manuella Ghazi  Encounter Date: 12/10/2015      PT End of Session - 12/10/15 1142    Visit Number 8   Number of Visits 17   Date for PT Re-Evaluation 01/10/16   Authorization Type G Code 8   Authorization Time Period 10   PT Start Time 1046   PT Stop Time 1133   PT Time Calculation (min) 47 min   Equipment Utilized During Treatment Gait belt   Activity Tolerance Patient tolerated treatment well   Behavior During Therapy WFL for tasks assessed/performed      Past Medical History:  Diagnosis Date  . Atrial fibrillation ([place])    controlled  . Hypertension    controlled by meds  . Parkinson disease ([place])     History reviewed. No pertinent surgical history.  There were no vitals filed for this visit.      Subjective Assessment - 12/10/15 1049    Subjective Patient states his back is feeling better since he has been laying down/sitting up the opposite direction in the bed. Again denies dizziness, mental status changes, etc.  His wife and him report that his balance hasn't been doing well today and almost had a fall.  He reports he does his HEP x1 a day sometimes 2x.    Patient is accompained by: Family member   Pertinent History Factors affecting Rehab: age, progressive disease, family support, limited activity level   Limitations Walking   How long can you sit comfortably? N/A   How long can you stand comfortably? patient states he is uncertain how to answer   How long can you walk comfortably? 120 feet   Patient Stated Goals improve balance because he has difficulty turning quickly    Currently in Pain? No/denies     Treatment:  Seated PWR exercises: PWR up: 2 x10, min VCs to increase  speed PWR weight shift: 2x10, min VCs to perform more weight shift PWR twist: 2x10, min VCs to increase opening in middle and slightly decreasing range of rotation due to LBP PWR step: x10, min VCs to increase step height even if slowly down the speed  Gait Training: Instructed to ambulate with rollator walker, x100 feet, CGA and close supervision, min VCs to decrease forward trunk flexion, Instructed to ambulate with RW, x75 feet, increased difficulty due to energy expenditure, min VCs to decrease forward trunk flexion With rollator and RW, patient has improved balance and safety with improved LE advancement. However, with all AD his balance is compromised during turning (R>L).  Patient instructed to use rollator and RW at home for safety. Demonstration, VCs, and handout given to instruct patient about locking rollator handles previous to standing/sitting and to push from chair instead of AD.   Seated shoulder retractions 4x10, in between walking, red tband resistance, min VCs to correct form and to increase scapular movement. Given red tband for home with directions on how to attach to door and stay seated. Min Vcs to increase posture while performing.    Functional Activity: Due to patient reporting continued mild LBP with bed mobility and stating he doesn't remember exactly how to perform. SPT again demonstrated proper bed mobility with log roll technique to decrease stress on low back.  Patient  performs towards the R, continues to require minA throughout.  Patient requires min-mod VCs to perform with appropriate sequencing especially when rolling towards the R, moving and from R sidelying to sitting.  Performed x2, requires less assistance especially during second set, during second set patient repeats sequencing to SPT before performing and reports decreased LBP reported.                           PT Education - 12/10/15 1140    Education provided Yes   Education  Details posture, bed mobility, using rollator or RW at home, being careful during turns    Person(s) Educated Patient;Spouse   Methods Explanation;Demonstration   Comprehension Verbalized understanding;Returned demonstration             PT Long Term Goals - 11/15/15 1415      PT LONG TERM GOAL #1   Title Patient will be independent in HEP in order to increase patient's ability to maintain gains achieved in therapy and assist with return to PLOF.    Time 8   Period Weeks   Status New     PT LONG TERM GOAL #2   Title Patient will increase overall strength to 4/5 in BLE to increase ROM/strength throughout functional activities performed in his ADLs.    Time 8   Period Weeks   Status New     PT LONG TERM GOAL #3   Title Patient will be able to independently ascend/descend 5 steps with R unilateral hand rail in order to allow for safe entrance/exit from his home.    Time 8   Period Weeks   Status New     PT LONG TERM GOAL #4   Title Patient will decrease the time it takes to perform 5 times sit to stand to <14 seconds without UE use in order to decrease his risk for falls.     Time 8   Period Weeks   Status New     PT LONG TERM GOAL #5   Title Patient will increase his 10 m walk test time to >1.0 m/s in order to be a community ambulator at decreased risk for falls.    Time 8   Period Weeks   Status New     Additional Long Term Goals   Additional Long Term Goals Yes     PT LONG TERM GOAL #6   Title Patient's Berg balance score will improve by 7 points in order to decrease patient's risk for falls and increase safety in his home.   Time 8   Period Weeks   Status New               Plan - 12/10/15 1142    Clinical Impression Statement Patient participated in PT well today. He continues to require min VCs to improve seated PWR exercises and to increas speed. Min verbal/tactile cueing as well as demonstration during bed mobility, Patient shows good motor learning  by being able to describe sequencing during second trial. Patient ambulated with [facility], rollator, and RW to assess safety for use at home. Patient instructed to use rollator or RW at home due to decreased balance and increased incidence of falls. Patient would continue to benefit from skilled PT in order to address LE weakness, balance issues, improve posture and safety during functional mobility   Rehab Potential Good   Clinical Impairments Affecting Rehab Potential Postive Factors: motivated, family support; Negative Factors: degenerative  disease, age, chronic issues Clinical Presenstation: Evolving-falls risk, progressive disease   PT Frequency 2x / week   PT Duration 8 weeks   PT Treatment/Interventions ADLs/Self Care Home Management;Aquatic Therapy;Biofeedback;Cryotherapy;Electrical Stimulation;Moist Heat;Traction;DME Instruction;Gait training;Stair training;Functional mobility training;Therapeutic activities;Therapeutic exercise;Balance training;Neuromuscular re-education;Patient/family education;Manual techniques;Passive range of motion;Energy conservation   PT Next Visit Plan address LBP prn, continue with PWR exercises, dynamic balance, postural muscles    PT Home Exercise Plan continue as given    Consulted and Agree with Plan of Care Patient;Family member/caregiver   Family Member Consulted wife      Patient will benefit from skilled therapeutic intervention in order to improve the following deficits and impairments:  Decreased activity tolerance, Decreased balance, Decreased coordination, Decreased endurance, Decreased mobility, Decreased range of motion, Decreased safety awareness, Decreased strength, Difficulty walking, Hypomobility, Impaired flexibility, Impaired sensation, Improper body mechanics, Postural dysfunction, Pain  Visit Diagnosis: Unsteadiness on feet  Muscle weakness (generalized)  Abnormal posture     Problem List There are no active problems to display for this  patient.  This entire session was performed under direct supervision and direction of a licensed therapist/therapist assistant . I have personally read, edited and approve of the note as written.   Phillips Grout PT, DPT   Tilman Neat, SPT Huprich,Jason 12/10/2015, 12:07 PM  [place] MAIN [facility] SERVICES [address] [place], [place], [address] Phone: [phone]   Fax:  [phone]  Name: Gavin Beltran. MRN: [id number] Date of Birth: Aug 05, 1923

## 2015-12-14 ENCOUNTER — Encounter: Payer: Self-pay | Admitting: Physical Therapy

## 2015-12-14 ENCOUNTER — Encounter: Payer: Medicare Other | Admitting: Speech Pathology

## 2015-12-14 ENCOUNTER — Ambulatory Visit: Payer: Medicare Other | Admitting: Physical Therapy

## 2015-12-14 DIAGNOSIS — R293 Abnormal posture: Secondary | ICD-10-CM

## 2015-12-14 DIAGNOSIS — R2681 Unsteadiness on feet: Secondary | ICD-10-CM | POA: Diagnosis not present

## 2015-12-14 DIAGNOSIS — M6281 Muscle weakness (generalized): Secondary | ICD-10-CM

## 2015-12-14 NOTE — Therapy (Signed)
[place] MAIN [facility] SERVICES [address] [place], [place], [address] Phone: [phone]   Fax:  [phone]  Physical Therapy Treatment  Patient Details  Name: Gavin Beltran. MRN: [id number] Date of Birth: 1923/11/07 Referring Provider: Dr. Manuella Ghazi  Encounter Date: 12/14/2015      PT End of Session - 12/14/15 1130    Visit Number 9   Number of Visits 17   Date for PT Re-Evaluation 01/10/16   Authorization Type G Code 9   Authorization Time Period 10   PT Start Time 1029   PT Stop Time 1115   PT Time Calculation (min) 46 min   Equipment Utilized During Treatment Gait belt   Activity Tolerance Patient tolerated treatment well   Behavior During Therapy WFL for tasks assessed/performed      Past Medical History:  Diagnosis Date  . Atrial fibrillation ([place])    controlled  . Hypertension    controlled by meds  . Parkinson disease ([place])     History reviewed. No pertinent surgical history.  There were no vitals filed for this visit.      Subjective Assessment - 12/14/15 1033    Subjective Patient states his back is feeling better, and it is easier to get into/out of bed without pain. Denies falls or head issues from previous fall. Patient states he is not using RW or rollator at home much.  He reports HEP is going well.    Patient is accompained by: Family member   Pertinent History Factors affecting Rehab: age, progressive disease, family support, limited activity level   Limitations Walking   How long can you sit comfortably? N/A   How long can you stand comfortably? patient states he is uncertain how to answer   How long can you walk comfortably? 120 feet   Patient Stated Goals improve balance because he has difficulty turning quickly    Currently in Pain? No/denies      Treatment:  NuStep, L3, x4 min, moderate to fast speed, BUE and LE, decreased to just RUE due to pain with mild shoulder popping on L.   PWR sit to  stands due to opening with shoulder extension, 2x10, instructed to perform with quicker speed, CGA for safety, patient becomes slightly winded requiring a rest break.  Intermittent ambulation with B platform RW, 2x60 feet CGA for safety, min VCs to increase posture and decrease lateral lean. Patient continues to have forward flexed and lateral lean to R posture; however he is more stable and does have improved posture compared to using the [facility].   Static standing at B platform walking, min VCs to stand tall, improved posture in static standing with decrease forward and lateral trunk lean.   Standing hip extension at B platform walker, BLE 2x10 each, min VCs to contract glut at end range of extension.  Forward and lateral toe tapping, 2x10, 1 HHA in // bars, CGA for safety, min VCs to increase step length and continue to watch himself in the mirror to improve posture. Forward toe tapping, 2x20, 1 HHA, min VCs to increase step length and perform quickly.   Min VCs to perform bed mobility with decreased lumbar rotation to decrease back pain and need for assistance. Requires minA to move from sidelying to sitting.    Supine PWR activities (initiated into HEP): Min VCs to lay in supine position for 1-2 minutes to allow for opening and to decrease LBP.  Patient requires min verbal/tactile cueing to  perform exercises correctly. Also min VCs to increase speed of performance while maintaining technique.  PWR posture: 2x10, min VCs to perform pushing chest out not a sit up position PWR weight shift: 2x10, min VCs to maintain BLE flat but to press in order to assist with improved weight shift. Also instructed to reach upwards higher and less across his body. PWR twist: 2x10, min VCs to perform rolling more to maintain BUE on mat during clapping and open in the middle PWR step: 2x10, min VCs to perform more hip flexion and faster speed.   Seated PWR step: 2x10, min verbal/tactile cueing to perform greater  step height.                                PT Education - 12/14/15 1129    Education provided Yes   Education Details posture, bed mobility, safety at home, performing PWR exercises quickly, HEP PWR exercises progressed   Person(s) Educated Patient   Methods Explanation;Demonstration;Tactile cues;Verbal cues;Handout   Comprehension Verbalized understanding;Returned demonstration;Tactile cues required;Verbal cues required             PT Long Term Goals - 11/15/15 1415      PT LONG TERM GOAL #1   Title Patient will be independent in HEP in order to increase patient's ability to maintain gains achieved in therapy and assist with return to PLOF.    Time 8   Period Weeks   Status New     PT LONG TERM GOAL #2   Title Patient will increase overall strength to 4/5 in BLE to increase ROM/strength throughout functional activities performed in his ADLs.    Time 8   Period Weeks   Status New     PT LONG TERM GOAL #3   Title Patient will be able to independently ascend/descend 5 steps with R unilateral hand rail in order to allow for safe entrance/exit from his home.    Time 8   Period Weeks   Status New     PT LONG TERM GOAL #4   Title Patient will decrease the time it takes to perform 5 times sit to stand to <14 seconds without UE use in order to decrease his risk for falls.     Time 8   Period Weeks   Status New     PT LONG TERM GOAL #5   Title Patient will increase his 10 m walk test time to >1.0 m/s in order to be a community ambulator at decreased risk for falls.    Time 8   Period Weeks   Status New     Additional Long Term Goals   Additional Long Term Goals Yes     PT LONG TERM GOAL #6   Title Patient's Berg balance score will improve by 7 points in order to decrease patient's risk for falls and increase safety in his home.   Time 8   Period Weeks   Status New               Plan - 12/14/15 1130    Clinical Impression Statement  Patient states he feels good today. He becomes mildly winded during quick sit to stand activities, but he feels better after a short break. Patient requires min verbal and tactile cueing for bed mobility, supine PWR exercises and standing posture. Patient reports mild LBP when sitting one time that does not repeat any other time. Patient  continues to demonstrate difficulty with LE sequencing and turning. Patient would continue to benefit from skilled PT in order to address balance, posture, and decrease risk for falls.    Rehab Potential Good   Clinical Impairments Affecting Rehab Potential Postive Factors: motivated, family support; Negative Factors: degenerative disease, age, chronic issues Clinical Presenstation: Evolving-falls risk, progressive disease   PT Frequency 2x / week   PT Duration 8 weeks   PT Treatment/Interventions ADLs/Self Care Home Management;Aquatic Therapy;Biofeedback;Cryotherapy;Electrical Stimulation;Moist Heat;Traction;DME Instruction;Gait training;Stair training;Functional mobility training;Therapeutic activities;Therapeutic exercise;Balance training;Neuromuscular re-education;Patient/family education;Manual techniques;Passive range of motion;Energy conservation   PT Next Visit Plan continue with PWR exercises, dynamic balance, postural muscle strengthening    PT Home Exercise Plan supine PWR exercises initiated   Consulted and Agree with Plan of Care Patient;Family member/caregiver   Family Member Consulted wife      Patient will benefit from skilled therapeutic intervention in order to improve the following deficits and impairments:  Decreased activity tolerance, Decreased balance, Decreased coordination, Decreased endurance, Decreased mobility, Decreased range of motion, Decreased safety awareness, Decreased strength, Difficulty walking, Hypomobility, Impaired flexibility, Impaired sensation, Improper body mechanics, Postural dysfunction, Pain  Visit  Diagnosis: Unsteadiness on feet  Muscle weakness (generalized)  Abnormal posture     Problem List There are no active problems to display for this patient.  Tilman Neat, SPT This entire session was performed under direct supervision and direction of a licensed therapist/therapist assistant . I have personally read, edited and approve of the note as written.  Trotter,Margaret PT, DPT 12/14/2015, 4:20 PM  [place] MAIN [facility] SERVICES [address] [place], [place], [address] Phone: [phone]   Fax:  [phone]  Name: Asadbek Blome. MRN: [id number] Date of Birth: 1923-05-07

## 2015-12-16 ENCOUNTER — Encounter: Payer: Medicare Other | Admitting: Speech Pathology

## 2015-12-16 ENCOUNTER — Ambulatory Visit: Payer: Medicare Other | Admitting: Physical Therapy

## 2015-12-21 ENCOUNTER — Ambulatory Visit: Payer: Medicare Other | Admitting: Physical Therapy

## 2015-12-21 ENCOUNTER — Encounter: Payer: Self-pay | Admitting: Physical Therapy

## 2015-12-21 DIAGNOSIS — R2681 Unsteadiness on feet: Secondary | ICD-10-CM

## 2015-12-21 DIAGNOSIS — R293 Abnormal posture: Secondary | ICD-10-CM

## 2015-12-21 DIAGNOSIS — M6281 Muscle weakness (generalized): Secondary | ICD-10-CM

## 2015-12-21 NOTE — Therapy (Signed)
[place] [facility] SERVICES [address] [place], [place], [address] Phone: [phone]   Fax:  [phone]  Physical Therapy Treatment/Progress Note  Patient Details  Name: Gavin Beltran. MRN: [id number] Date of Birth: [birth year] Referring Provider: Dr. Manuella Ghazi  Encounter Date: 12/21/2015      PT End of Session - 12/21/15 1252    Visit Number 10   Number of Visits 29   Date for PT Re-Evaluation 02/01/16   Authorization Type G Code 10   Authorization Time Period 10   PT Start Time 1116   PT Stop Time 1200   PT Time Calculation (min) 44 min   Equipment Utilized During Treatment Gait belt   Activity Tolerance Patient tolerated treatment well   Behavior During Therapy WFL for tasks assessed/performed      Past Medical History:  Diagnosis Date  . Atrial fibrillation ([place])    controlled  . Hypertension    controlled by meds  . Parkinson disease ([place])     History reviewed. No pertinent surgical history.  There were no vitals filed for this visit.      Subjective Assessment - 12/21/15 1120    Subjective Patient states his back pain has increased mildly after the last exercise.  Patient's wife states she wants to take him to the chiropractor. He states he was sore after last session. He denies falls and that he still does not use the RW at home.    Patient is accompained by: Family member   Pertinent History Factors affecting Rehab: age, progressive disease, family support, limited activity level   Limitations Walking   How long can you sit comfortably? N/A   How long can you stand comfortably? patient states he is uncertain how to answer   How long can you walk comfortably? 120 feet   Patient Stated Goals improve balance because he has difficulty turning quickly    Currently in Pain? Yes   Pain Score 4    Pain Location Back   Pain Orientation Right            [facility] PT Assessment - 12/22/15 0001      Strength   Right  Hip Flexion 3+/5   Right Hip ABduction 4+/5   Right Hip ADduction 4+/5   Left Hip Flexion 4-/5   Left Hip ABduction 4+/5   Left Hip ADduction 4+/5   Right Knee Flexion 4/5   Right Knee Extension 4/5   Left Knee Flexion 4/5   Left Knee Extension 4-/5   Right Ankle Dorsiflexion 4+/5   Left Ankle Dorsiflexion 4+/5     Standardized Balance Assessment   Five times sit to stand comments  28.6s, mild increase compared to 24.2s on 11/15/15, >60 y.o. >14s indicates increased risk for falls   10 Meter Walk 0.63 m/s, mild decrease since 11/15/15 at 0.74 m/s, CGA, no AD, limited community ambulator, increased risk for falls     [employer]   Sit to Stand Able to stand  independently using hands   Standing Unsupported Able to stand safely 2 minutes   Sitting with Back Unsupported but Feet Supported on Floor or Stool Able to sit safely and securely 2 minutes   Stand to Sit Controls descent by using hands   Transfers Able to transfer safely, definite need of hands   Standing Unsupported with Eyes Closed Able to stand 10 seconds with supervision   Standing Ubsupported with Feet Together Able to place feet  together independently and stand 1 minute safely   From Standing, Reach Forward with Outstretched Arm Can reach forward >12 cm safely (5")   From Standing Position, Pick up Object from [place] to pick up shoe safely and easily   From Standing Position, Turn to Look Behind Over each Shoulder Looks behind one side only/other side shows less weight shift   Turn 360 Degrees Needs close supervision or verbal cueing   Standing Unsupported, Alternately Place Feet on Step/Stool Able to stand independently and complete 8 steps >20 seconds   Standing Unsupported, One Foot in Front Able to plae foot ahead of the other independently and hold 30 seconds   Standing on One Leg Able to lift leg independently and hold equal to or more than 3 seconds   Total Score 43   Berg comment: Improved from 11/18/15 at  39/56, <45/56 indicates increased risk for falls      Treatment:  Patient's goals and outcome measures were re-assessed. He was instructed in the Berg, 5xSTS, 10MWT.   Patient requires min-mod VCs to perform activities well and close supervision to minA during activities involving balance.  TUG not reassessed due to time constraints.  Patient instructed about continuing plan of care and the changes in his outcome measures.  Instructed to monitor posture throughout the day to decrease LBP such as sitting up tall but not in an over extended posture. He was also instructed to take walking breaks instead of staying seated all the time in order to decrease stress placed on his lower back.                        PT Education - 12/21/15 1252    Education provided Yes   Education Details goals and outcome measures assessed, posture   Person(s) Educated Patient;Spouse   Methods Explanation;Demonstration   Comprehension Verbalized understanding;Returned demonstration             PT Long Term Goals - 12/21/15 1257      PT LONG TERM GOAL #1   Title Patient will be independent in HEP in order to increase patient's ability to maintain gains achieved in therapy and assist with return to PLOF.    Time 6   Period Weeks   Status On-going     PT LONG TERM GOAL #2   Title Patient will increase overall strength to 4/5 in BLE to increase ROM/strength throughout functional activities performed in his ADLs.    Time 6   Period Weeks   Status Partially Met     PT LONG TERM GOAL #3   Title Patient will be able to independently ascend/descend 5 steps with R unilateral hand rail in order to allow for safe entrance/exit from his home.    Time 6   Period Weeks   Status On-going     PT LONG TERM GOAL #4   Title Patient will decrease the time it takes to perform 5 times sit to stand to <14 seconds without UE use in order to decrease his risk for falls.     Time 6   Period Weeks    Status On-going     PT LONG TERM GOAL #5   Title Patient will increase his 10 m walk test time to >1.0 m/s in order to be a community ambulator at decreased risk for falls.    Time 6   Period Weeks   Status On-going     PT LONG TERM  GOAL #6   Title Patient's Berg balance score will improve by 7 points in order to decrease patient's risk for falls and increase safety in his home.   Time 6   Period Weeks   Status Partially Met               Plan - 01/09/2016 1254    Clinical Impression Statement Patient has intermittent back pain today that mostly increases with sit to stands, slumped posture, or very erect posture. Patient requires short breaks during treatment. Patient's goals and outcome measures were re-assessed today. He has improvements in some areas such as Berg but not in others such as low back pain, 5x sit to stand, and 10WMT. Patient would continue to benefit from skilled PT in order to address dynamic balance, functional safety, and improve ability to perform functional tasks.    Rehab Potential Good   Clinical Impairments Affecting Rehab Potential Postive Factors: motivated, family support; Negative Factors: degenerative disease, age, chronic issues Clinical Presenstation: Evolving-falls risk, progressive disease   PT Frequency 2x / week   PT Duration 6 weeks   PT Treatment/Interventions ADLs/Self Care Home Management;Aquatic Therapy;Biofeedback;Cryotherapy;Electrical Stimulation;Moist Heat;Traction;DME Instruction;Gait training;Stair training;Functional mobility training;Therapeutic activities;Therapeutic exercise;Balance training;Neuromuscular re-education;Patient/family education;Manual techniques;Passive range of motion;Energy conservation   PT Next Visit Plan continue with PWR exercises prone, functional activities with muscle strengthening   PT Home Exercise Plan supine PWR exercises initiated   Consulted and Agree with Plan of Care Patient;Family member/caregiver    Family Member Consulted wife      Patient will benefit from skilled therapeutic intervention in order to improve the following deficits and impairments:  Decreased activity tolerance, Decreased balance, Decreased coordination, Decreased endurance, Decreased mobility, Decreased range of motion, Decreased safety awareness, Decreased strength, Difficulty walking, Hypomobility, Impaired flexibility, Impaired sensation, Improper body mechanics, Postural dysfunction, Pain  Visit Diagnosis: Unsteadiness on feet - Plan: PT plan of care cert/re-cert  Muscle weakness (generalized) - Plan: PT plan of care cert/re-cert  Abnormal posture - Plan: PT plan of care cert/re-cert       G-Codes - 2016/01/09 1200    Functional Assessment Tool Used 10 meter walk, 5 times sit<>Stand, clinical judgement   Functional Limitation Mobility: Walking and moving around   Mobility: Walking and Moving Around Current Status (O8416) At least 40 percent but less than 60 percent impaired, limited or restricted   Mobility: Walking and Moving Around Goal Status (S0630) At least 20 percent but less than 40 percent impaired, limited or restricted      Problem List There are no active problems to display for this patient.  Tilman Neat, SPT This entire session was performed under direct supervision and direction of a licensed therapist/therapist assistant . I have personally read, edited and approve of the note as written.  Trotter,Margaret PT, DPT 12/22/2015, 9:06 AM  [place] [facility] SERVICES [address] [place], [place], [address] Phone: [phone]   Fax:  [phone]  Name: Gavin Beltran. MRN: [id number] Date of Birth: 10/05/1923

## 2015-12-23 ENCOUNTER — Ambulatory Visit: Payer: Medicare Other | Admitting: Physical Therapy

## 2015-12-23 ENCOUNTER — Encounter: Payer: Self-pay | Admitting: Physical Therapy

## 2015-12-23 DIAGNOSIS — M6281 Muscle weakness (generalized): Secondary | ICD-10-CM

## 2015-12-23 DIAGNOSIS — R2681 Unsteadiness on feet: Secondary | ICD-10-CM

## 2015-12-23 DIAGNOSIS — R293 Abnormal posture: Secondary | ICD-10-CM

## 2015-12-23 NOTE — Therapy (Signed)
[place] [facility] SERVICES [address] [place], [place], [address] Phone: [phone]   Fax:  [phone]  Physical Therapy Treatment  Patient Details  Name: Gavin Beltran. MRN: [id number] Date of Birth: 28-Aug-1923 Referring Provider: Dr. Manuella Ghazi  Encounter Date: 12/23/2015      PT End of Session - 12/23/15 1211    Visit Number 11   Number of Visits 29   Date for PT Re-Evaluation 02/01/16   Authorization Type G Code 1   Authorization Time Period 10   PT Start Time 1116   PT Stop Time 1204   PT Time Calculation (min) 48 min   Equipment Utilized During Treatment Gait belt   Activity Tolerance Patient tolerated treatment well   Behavior During Therapy WFL for tasks assessed/performed      Past Medical History:  Diagnosis Date  . Atrial fibrillation ([place])    controlled  . Hypertension    controlled by meds  . Parkinson disease ([place])     History reviewed. No pertinent surgical history.  There were no vitals filed for this visit.      Subjective Assessment - 12/23/15 1121    Subjective Patient and wife state that he had a fall on Tuesday when he was not using an AD and trying to take a small step up onto cement. He states his L leg didn't get all the way up and tripped him.  Per wife, patient's back pain is getting worse and he feels it when he gets up from sleeping and moving from sit to stand.    Patient is accompained by: Family member   Pertinent History Factors affecting Rehab: age, progressive disease, family support, limited activity level   Limitations Walking   How long can you sit comfortably? N/A   How long can you stand comfortably? patient states he is uncertain how to answer   How long can you walk comfortably? 120 feet   Patient Stated Goals improve balance because he has difficulty turning quickly    Currently in Pain? No/denies      Treatment:  Warm up on Nustep, BUE/LE, L3, history taken the first 2  minutes, 3 minutes unbilled Instructed patient and wife about performing HEP consistently at home at a high level in order to see the most gains from therapy. SPT again recommended using walker at home for safety and improved posture.  Bed mobility: patient requires min-mod VCs and min tactile cueing in order to sequence bed mobility and move LE with UE. Patient continues to show poor demonstration of log roll during bed mobility and requires min VCs and demonstration.  PWR exercises (patient requires min-mod VCs and min tactile cueing in order to improve technique, sequencing, speed, and count out loud) Instructed to maintain supine and prone position for 1-2 minutes until back pain subsides. Supine: PWR Up: 2x10, min VCs to get into position andperform chest raising and not at the hips PWR Rock: 3x10, min-mod VCs to increase BUE reach and push through LE in order to gain more motion PWR Twist: 2x10, min VCs to perform as if rolling and open in between in order to effect posture PWR Step: 2x10, min VCs to perform more slowly and lift BLE higher in order to improve step sequencing Prone: Patient reports mild back pain during PWR Up until instructed to move in a smaller range of motion to decrease pain. PWR Up: 2x10, min-mod VCs to maintain hips on table and push up  with arms PWR Rock: 2x10, min VCs to perform weight shift to opposite side before reaching with opposite UE.  PWR Twist: x5, min VCs to hold on this exercise until able to further instruct on it  PWR Step: x10, min VCs and demonstration on how to perform the exercise with weight shifts to the opposite side and moving other UE out/in. Patient is given breaks after each exercise due to fatigue and difficulty with exercises.                            PT Education - 12/23/15 1209    Education provided Yes   Education Details HEP progressed, performing 2xday   Person(s) Educated Patient   Methods  Explanation;Handout;Verbal cues;Tactile cues   Comprehension Verbalized understanding;Verbal cues required;Tactile cues required             PT Long Term Goals - 12/21/15 1257      PT LONG TERM GOAL #1   Title Patient will be independent in HEP in order to increase patient's ability to maintain gains achieved in therapy and assist with return to PLOF.    Time 6   Period Weeks   Status On-going     PT LONG TERM GOAL #2   Title Patient will increase overall strength to 4/5 in BLE to increase ROM/strength throughout functional activities performed in his ADLs.    Time 6   Period Weeks   Status Partially Met     PT LONG TERM GOAL #3   Title Patient will be able to independently ascend/descend 5 steps with R unilateral hand rail in order to allow for safe entrance/exit from his home.    Time 6   Period Weeks   Status On-going     PT LONG TERM GOAL #4   Title Patient will decrease the time it takes to perform 5 times sit to stand to <14 seconds without UE use in order to decrease his risk for falls.     Time 6   Period Weeks   Status On-going     PT LONG TERM GOAL #5   Title Patient will increase his 10 m walk test time to >1.0 m/s in order to be a community ambulator at decreased risk for falls.    Time 6   Period Weeks   Status On-going     PT LONG TERM GOAL #6   Title Patient's Berg balance score will improve by 7 points in order to decrease patient's risk for falls and increase safety in his home.   Time 6   Period Weeks   Status Partially Met               Plan - 12/23/15 1211    Clinical Impression Statement Patient continues to have back pain, which his wife states is getting worse. During bed mobility, the patient shows poor carryover of log roll technique and increased reports of LBP. Patient requires min-mod VCs and min tactile cues in order to perform PWR exercises appropriately. The patient does not remember many of the exercises, but his wife shows  good understanding and is able to direct him. Patient demonstrates UE, LE, and trunk weakness with an inability to perform PWR exercises such as rolling or reaching over head well. Patient would continue to benefit from skilled PT in order to decrease his risk of falls, increase his functional mobility, and improve his gait safety.    Rehab   Potential Good   Clinical Impairments Affecting Rehab Potential Postive Factors: motivated, family support; Negative Factors: degenerative disease, age, chronic issues Clinical Presenstation: Evolving-falls risk, progressive disease   PT Frequency 2x / week   PT Duration 6 weeks   PT Treatment/Interventions ADLs/Self Care Home Management;Aquatic Therapy;Biofeedback;Cryotherapy;Electrical Stimulation;Moist Heat;Traction;DME Instruction;Gait training;Stair training;Functional mobility training;Therapeutic activities;Therapeutic exercise;Balance training;Neuromuscular re-education;Patient/family education;Manual techniques;Passive range of motion;Energy conservation   PT Next Visit Plan continue with PWR exercises prone, Review and move to standing PWR exercises   PT Home Exercise Plan HEP progressed; patient now has seated, supine, and prone PWR exercises   Consulted and Agree with Plan of Care Patient;Family member/caregiver   Family Member Consulted wife      Patient will benefit from skilled therapeutic intervention in order to improve the following deficits and impairments:  Decreased activity tolerance, Decreased balance, Decreased coordination, Decreased endurance, Decreased mobility, Decreased range of motion, Decreased safety awareness, Decreased strength, Difficulty walking, Hypomobility, Impaired flexibility, Impaired sensation, Improper body mechanics, Postural dysfunction, Pain  Visit Diagnosis: Unsteadiness on feet  Muscle weakness (generalized)  Abnormal posture     Problem List There are no active problems to display for this patient.  Tilman Neat, SPT This entire session was performed under direct supervision and direction of a licensed therapist/therapist assistant . I have personally read, edited and approve of the note as written.  Trotter,Margaret PT, DPT 12/23/2015, 3:46 PM  [place] [facility] SERVICES [address] [place], [place], [address] Phone: [phone]   Fax:  [phone]  Name: Gavin Beltran. MRN: [id number] Date of Birth: 05/29/1923

## 2015-12-28 ENCOUNTER — Encounter: Payer: Self-pay | Admitting: Physical Therapy

## 2015-12-28 ENCOUNTER — Ambulatory Visit: Payer: Medicare Other | Admitting: Physical Therapy

## 2015-12-28 DIAGNOSIS — R293 Abnormal posture: Secondary | ICD-10-CM

## 2015-12-28 DIAGNOSIS — R2681 Unsteadiness on feet: Secondary | ICD-10-CM

## 2015-12-28 DIAGNOSIS — M6281 Muscle weakness (generalized): Secondary | ICD-10-CM

## 2015-12-28 NOTE — Patient Instructions (Signed)
NuStep, L3 x5 minutes, BUE/LE (2 minutes billed for history taking, 3 minutes unbilled)   Seater PWR Instructed in flow, x1 time through Flow, 2x10,   Supine  Prone

## 2015-12-28 NOTE — Therapy (Signed)
[place] MAIN [facility] SERVICES [address] [place], [place], [address] Phone: [phone]   Fax:  [phone]  Physical Therapy Treatment  Patient Details  Name: Gavin Beltran. MRN: [id number] Date of Birth: 04/07/1924 Referring Provider: Dr. Manuella Ghazi  Encounter Date: 12/28/2015      PT End of Session - 12/28/15 1338    Visit Number 12   Number of Visits 29   Date for PT Re-Evaluation 02/01/16   Authorization Type G Code 2   Authorization Time Period 10   PT Start Time 1114   PT Stop Time 1202   PT Time Calculation (min) 48 min   Equipment Utilized During Treatment Gait belt   Activity Tolerance Patient limited by pain   Behavior During Therapy WFL for tasks assessed/performed      Past Medical History:  Diagnosis Date  . Atrial fibrillation ([place])    controlled  . Hypertension    controlled by meds  . Parkinson disease ([place])     History reviewed. No pertinent surgical history.  There were no vitals filed for this visit.      Subjective Assessment - 12/28/15 1117    Subjective Per wife, patient went to the chiropractor and had an adjustment done for his back. Wife and patient report that patient's back has caused him less pain, but that it may take 3 days to know the effects. Denies falls. States he was not able to do HEP due to business.    Patient is accompained by: Family member   Pertinent History Factors affecting Rehab: age, progressive disease, family support, limited activity level   Limitations Walking   How long can you sit comfortably? N/A   How long can you stand comfortably? patient states he is uncertain how to answer   How long can you walk comfortably? 120 feet   Patient Stated Goals improve balance because he has difficulty turning quickly    Currently in Pain? No/denies       Treatment:  Warm up on Nustep x5 min., BUE/LE, L3, unbilled  Bed mobility: patient requires min VCs in order to sequence bed  mobility and move LE with UE. Patient continues to show poor demonstration of log roll during bed mobility and requires min VCs and demonstration for how to perform properly. Able to perform appropriately when moving from sidelying to seated with min VCs and minA.   PWR exercises (patient continues to require min-mod VCs and min tactile cueing in order to improve technique, sequencing, speed, and count out loud) Ability to perform exercises improved since last session.  Seated PWR flow: Demonstrated and performed each activity x1 in flow and then performed. Seated flow, 2x5 with demonstration and min VCs to improve form throughout treatment.  Instructed to maintain supine and prone position for 1-2 minutes until back pain subsides. Supine: PWR Up: 2x10, min VCs to perform chest raising and not at the hips PWR Rock: 2x10, min VCs and min tactile cueing to increase BUE reach and push through LE in order to gain more motion. PWR Twist: 2x10, min VCs to perform with opening in between in order to improve posture. PWR Step: 2x10, min VCs to perform more slowly and in smaller movements and to lift BLE higher in order to improve step sequencing  Laid in prone x1 minute to decrease back pain. Patient has intermittent low back pain that patient states is mild but increases by the end of the prone PWR rock and  step; it relieves or decreases to very mild when instructed to lay in full prone position to decrease pain. After moving from prone position and sitting up, patient has 5/10 LBP that eases slightly with movement and soft tissue mobilization ~2 minutes. Full prone rests in between sets ~30 seconds to 1 min.  Prone: PWR Up: 2x10, min VCs how to perform initially PWR Rock: 2x10, min VCs to perform weight shift to opposite side before reaching with opposite UE, improved reaching distance compared to last session.  PWR Twist: 2x10, min VCs on how to perform the exercise and obtain initial  positioning. PWR Step: 2x10, min VCs to perform with weight shifts to the opposite side and to move the other UE out/in.                           PT Education - 12/28/15 1338    Education provided Yes   Education Details performing HEP at home, icing to relieve pain   Person(s) Educated Patient   Methods Explanation   Comprehension Verbalized understanding             PT Long Term Goals - 12/21/15 1257      PT LONG TERM GOAL #1   Title Patient will be independent in HEP in order to increase patient's ability to maintain gains achieved in therapy and assist with return to PLOF.    Time 6   Period Weeks   Status On-going     PT LONG TERM GOAL #2   Title Patient will increase overall strength to 4/5 in BLE to increase ROM/strength throughout functional activities performed in his ADLs.    Time 6   Period Weeks   Status Partially Met     PT LONG TERM GOAL #3   Title Patient will be able to independently ascend/descend 5 steps with R unilateral hand rail in order to allow for safe entrance/exit from his home.    Time 6   Period Weeks   Status On-going     PT LONG TERM GOAL #4   Title Patient will decrease the time it takes to perform 5 times sit to stand to <14 seconds without UE use in order to decrease his risk for falls.     Time 6   Period Weeks   Status On-going     PT LONG TERM GOAL #5   Title Patient will increase his 10 m walk test time to >1.0 m/s in order to be a community ambulator at decreased risk for falls.    Time 6   Period Weeks   Status On-going     PT LONG TERM GOAL #6   Title Patient's Berg balance score will improve by 7 points in order to decrease patient's risk for falls and increase safety in his home.   Time 6   Period Weeks   Status Partially Met               Plan - 12/28/15 1339    Clinical Impression Statement Patient reports no back pain upon arrival to PT today. With movement however he has a mild  increase especially in the prone position. Patient was able to participate in therapy in the sitting and supine position with mild back pain that relieved. In the prone position he notes not pain in flat position, when on elbows he reports mild back pain but not too bad. With all the way prone breaks, patient  states that back pain is not present or barely present. He requires min verbal and tactile cueing to perform PWR exercises appropriately.  When patient moved from sidelying to sitting his back pain increased to a 5/10. With soft tissue mobilization x2 min., walking, and moving he states his back pain has improved although he describes it as still "tender".  Patient declined ice to help with pain relief but was instructed he could perform at home to decrease pain. Patient would continue to benefit from skilled PT in order to address LBP,  falls, and safety with mobility.    Rehab Potential Good   Clinical Impairments Affecting Rehab Potential Postive Factors: motivated, family support; Negative Factors: degenerative disease, age, chronic issues Clinical Presenstation: Evolving-falls risk, progressive disease   PT Frequency 2x / week   PT Duration 6 weeks   PT Treatment/Interventions ADLs/Self Care Home Management;Aquatic Therapy;Biofeedback;Cryotherapy;Electrical Stimulation;Moist Heat;Traction;DME Instruction;Gait training;Stair training;Functional mobility training;Therapeutic activities;Therapeutic exercise;Balance training;Neuromuscular re-education;Patient/family education;Manual techniques;Passive range of motion;Energy conservation   PT Next Visit Plan continue with PWR exercises prone, Review and move to standing PWR exercises   PT Home Exercise Plan HEP progressed; patient now has seated, supine, and prone PWR exercises   Consulted and Agree with Plan of Care Patient;Family member/caregiver   Family Member Consulted wife      Patient will benefit from skilled therapeutic intervention in  order to improve the following deficits and impairments:  Decreased activity tolerance, Decreased balance, Decreased coordination, Decreased endurance, Decreased mobility, Decreased range of motion, Decreased safety awareness, Decreased strength, Difficulty walking, Hypomobility, Impaired flexibility, Impaired sensation, Improper body mechanics, Postural dysfunction, Pain  Visit Diagnosis: Unsteadiness on feet  Muscle weakness (generalized)  Abnormal posture     Problem List There are no active problems to display for this patient.  Tilman Neat, SPT This entire session was performed under direct supervision and direction of a licensed therapist/therapist assistant . I have personally read, edited and approve of the note as written.  Trotter,Margaret PT, DPT 12/28/2015, 4:07 PM  [place] MAIN [facility] SERVICES [address] [place], [place], [address] Phone: [phone]   Fax:  [phone]  Name: Aijalon Kirtz. MRN: [id number] Date of Birth: January 29, 1924

## 2015-12-30 ENCOUNTER — Ambulatory Visit: Payer: Medicare Other | Admitting: Physical Therapy

## 2015-12-30 DIAGNOSIS — R293 Abnormal posture: Secondary | ICD-10-CM

## 2015-12-30 DIAGNOSIS — R2681 Unsteadiness on feet: Secondary | ICD-10-CM | POA: Diagnosis not present

## 2015-12-30 DIAGNOSIS — M6281 Muscle weakness (generalized): Secondary | ICD-10-CM

## 2015-12-30 NOTE — Therapy (Signed)
[place] MAIN [facility] SERVICES [address] [place], [place], [address] Phone: [phone]   Fax:  [phone]  Physical Therapy Treatment  Patient Details  Name: Gavin Beltran. MRN: [id number] Date of Birth: 01/01/1924 Referring Provider: Dr. Manuella Ghazi  Encounter Date: 12/30/2015      PT End of Session - 12/30/15 1600    Visit Number 13   Number of Visits 29   Date for PT Re-Evaluation 02/01/16   Authorization Type G Code 3   Authorization Time Period 10   PT Start Time 9935   PT Stop Time 1550   PT Time Calculation (min) 65 min   Equipment Utilized During Treatment Gait belt   Activity Tolerance Patient limited by pain   Behavior During Therapy WFL for tasks assessed/performed      Past Medical History:  Diagnosis Date  . Atrial fibrillation ([place])    controlled  . Hypertension    controlled by meds  . Parkinson disease ([place])     No past surgical history on file.  There were no vitals filed for this visit.      Subjective Assessment - 12/30/15 1556    Subjective Patient reports having some soreness since last sesssion. He reports overall an increase in back pain stiffness and soreness especially in the mornings. Patient has a Restaurant manager, fast food treatment tomorrow. He reports lack of compliance with HEP due to soreness and stiffness.    Patient is accompained by: Family member   Pertinent History Factors affecting Rehab: age, progressive disease, family support, limited activity level   Limitations Walking   How long can you sit comfortably? N/A   How long can you stand comfortably? patient states he is uncertain how to answer   How long can you walk comfortably? 120 feet   Patient Stated Goals improve balance because he has difficulty turning quickly    Currently in Pain? Yes   Pain Score 4    Pain Location Back   Pain Orientation Right;Lower   Pain Descriptors / Indicators Aching;Sore;Tightness         TREATMENT: PT  instructed patient in seated PWR flow x5 reps; Patient required initial demonstration for remembrance of each exercise. He required mod VCs to increase open hand, increase reach overhead, increase clap sound for better coordination and reach and to increase loudness of stomp for better hip mobility; Patient reports increased fatigue with flow and required increased cues for better transition between movements;  Patient required Min A for getting in supine position, utilizing log roll technique. He required mod VCs for positioning; Instructed patient in lying in supine for 2-3 min to improve stretch throughout spine. Instructed patient in supine PWR exercise x10 reps each with mod VCs for increase reach, better rock, increased clap and increased stomp with step; Patient was able to increase reach with power rock with tactile cues; Patient reports minimal fatigue at end of exercise without increase in back pain;  Patient instructed in prone PWR exercise x10 each with cues for increase lumbar extension, increased reach with power rock and to increase clap with power twist. Patient able to demonstrate better weight shift with improved UE movement with prone positioning. He did have some back discomfort but that was relieved with prolonged prone lying.  Patient required min A for getting back up in sitting position; Provided patient with written instructions for HEP including walking program, PWR exercise and supine lying.   Patient instructed in gait training on treadmill, interval  training 1.2-1.8 mph with 2 HHA x3 min with 30 sec intervals. Patient able to demonstrate better step length and foot clearance with treadmill walking. He does demonstrate increased flexed positioning and poor positioning with prolonged standing requiring cues to increase erect head and trunk extension.                         PT Education - 12/30/15 1600    Education provided Yes   Education Details HEP,  compliance,    Person(s) Educated Patient   Methods Explanation;Verbal cues;Tactile cues   Comprehension Verbalized understanding;Verbal cues required;Tactile cues required             PT Long Term Goals - 12/21/15 1257      PT LONG TERM GOAL #1   Title Patient will be independent in HEP in order to increase patient's ability to maintain gains achieved in therapy and assist with return to PLOF.    Time 6   Period Weeks   Status On-going     PT LONG TERM GOAL #2   Title Patient will increase overall strength to 4/5 in BLE to increase ROM/strength throughout functional activities performed in his ADLs.    Time 6   Period Weeks   Status Partially Met     PT LONG TERM GOAL #3   Title Patient will be able to independently ascend/descend 5 steps with R unilateral hand rail in order to allow for safe entrance/exit from his home.    Time 6   Period Weeks   Status On-going     PT LONG TERM GOAL #4   Title Patient will decrease the time it takes to perform 5 times sit to stand to <14 seconds without UE use in order to decrease his risk for falls.     Time 6   Period Weeks   Status On-going     PT LONG TERM GOAL #5   Title Patient will increase his 10 m walk test time to >1.0 m/s in order to be a community ambulator at decreased risk for falls.    Time 6   Period Weeks   Status On-going     PT LONG TERM GOAL #6   Title Patient's Berg balance score will improve by 7 points in order to decrease patient's risk for falls and increase safety in his home.   Time 6   Period Weeks   Status Partially Met               Plan - 12/30/15 1605    Clinical Impression Statement Patient reports some soreness in his back today. He presents to therapy with increased forward flexion at lumbar spine. Patient was instructed in power exercise in sitting, supine and prone. He initially had trouble lying in supine due to stiffness. However was able to stretch into full position with 5 min of  rest. Patient required mod VCs and tactile cues to increase ROM and coordination with better clap, open hand, reach, louder stomp etc for better quality of movement. Provided written handout for increased compliance with HEP; Patient also instructed in gait training on treadmill. He was able to demonstrates better step length and foot clearance as compared to walking on firm ground with SPC. Patient would benefit from additional skilled PT Intervention to improve balance/gait safety and reduce fall risk;Emphasized importance of HEP compliance.    Rehab Potential Good   Clinical Impairments Affecting Rehab Potential Postive Factors: motivated, family  support; Negative Factors: degenerative disease, age, chronic issues Clinical Presenstation: Evolving-falls risk, progressive disease   PT Frequency 2x / week   PT Duration 6 weeks   PT Treatment/Interventions ADLs/Self Care Home Management;Aquatic Therapy;Biofeedback;Cryotherapy;Electrical Stimulation;Moist Heat;Traction;DME Instruction;Gait training;Stair training;Functional mobility training;Therapeutic activities;Therapeutic exercise;Balance training;Neuromuscular re-education;Patient/family education;Manual techniques;Passive range of motion;Energy conservation   PT Next Visit Plan continue with PWR exercises prone, Review and move to standing PWR exercises   PT Home Exercise Plan HEP progressed; patient now has seated, supine, and prone PWR exercises   Consulted and Agree with Plan of Care Patient;Family member/caregiver   Family Member Consulted wife      Patient will benefit from skilled therapeutic intervention in order to improve the following deficits and impairments:  Decreased activity tolerance, Decreased balance, Decreased coordination, Decreased endurance, Decreased mobility, Decreased range of motion, Decreased safety awareness, Decreased strength, Difficulty walking, Hypomobility, Impaired flexibility, Impaired sensation, Improper body  mechanics, Postural dysfunction, Pain  Visit Diagnosis: Unsteadiness on feet  Muscle weakness (generalized)  Abnormal posture     Problem List There are no active problems to display for this patient.   Makai Dumond PT, DPT 12/30/2015, 4:13 PM  [place] MAIN [facility] SERVICES [address] [place], [place], [address] Phone: [phone]   Fax:  [phone]  Name: Barrington Worley. MRN: [id number] Date of Birth: 09-May-1923

## 2016-01-04 ENCOUNTER — Encounter: Payer: Self-pay | Admitting: Physical Therapy

## 2016-01-04 ENCOUNTER — Ambulatory Visit: Payer: Medicare Other | Attending: Neurology | Admitting: Physical Therapy

## 2016-01-04 DIAGNOSIS — R293 Abnormal posture: Secondary | ICD-10-CM | POA: Insufficient documentation

## 2016-01-04 DIAGNOSIS — R2681 Unsteadiness on feet: Secondary | ICD-10-CM | POA: Insufficient documentation

## 2016-01-04 DIAGNOSIS — M6281 Muscle weakness (generalized): Secondary | ICD-10-CM | POA: Diagnosis present

## 2016-01-04 NOTE — Therapy (Signed)
[place] MAIN [facility] SERVICES [address] [place], [place], [address] Phone: [phone]   Fax:  [phone]  Physical Therapy Treatment  Patient Details  Name: Gavin Beltran. MRN: [id number] Date of Birth: 05-10-1923 Referring Provider: Dr. Manuella Ghazi  Encounter Date: 01/04/2016      PT End of Session - 01/04/16 1259    Visit Number 14   Number of Visits 29   Date for PT Re-Evaluation 02/01/16   Authorization Type G Code 4   Authorization Time Period 10   PT Start Time 1108   PT Stop Time 1201   PT Time Calculation (min) 53 min   Equipment Utilized During Treatment Gait belt   Activity Tolerance Patient limited by pain   Behavior During Therapy WFL for tasks assessed/performed      Past Medical History:  Diagnosis Date  . Atrial fibrillation ([place])    controlled  . Hypertension    controlled by meds  . Parkinson disease ([place])     History reviewed. No pertinent surgical history.  There were no vitals filed for this visit.      Subjective Assessment - 01/04/16 1111    Subjective Patient notes his back pain is very low today but when waking up he's at a 10/10 pain. Patient initially denies a fall but states he had one later in the session. Report attempting to step onto an object with no AD or handrail and falling backwards on his bottom and scraping his L elbow. He states he has been able to do his exercises usually once a day except for yesterday.     Patient is accompained by: Family member   Pertinent History Factors affecting Rehab: age, progressive disease, family support, limited activity level   Limitations Walking   How long can you sit comfortably? N/A   How long can you stand comfortably? patient states he is uncertain how to answer   How long can you walk comfortably? 120 feet   Patient Stated Goals improve balance because he has difficulty turning quickly    Currently in Pain? Yes   Pain Score 1    Pain Location  Back      Treatment:  Treadmill training; CGA for safety, x4 min. 1.5 mph and 1.8 mph intervals x30 seconds each, min VCs to increase step length, perform hip/knee flexion in order to decrease RLE dragging, and maintain upright posture. Difficulty maintaining upright posture as time progressed.  Seated PWR flow: Seated flow, x5 with demonstration and min VCs to initiate and improve form throughout treatment. Supine position for 2-3 minutes; LBP subsides.  Supine: PWR Up: x10, min VCs for initial positioning PWR Rock: x10, min VCs and min tactile cueing to increase BUE reach and push through LE in order to gain more motion. PWR Twist: x10, min VCs to for initiation and counting out loud. PWR Step: x10, min VCs for initial positioning and to increase movement range.   Instructed patient in bed mobility with min VCs and demonstration. Patient continues to report large increase in LBP even with log roll technique.  Before moving from supine to sitting, instructed patient to loosen lower back: Lumbar rotations: x10, instructed to focus on moving R only, moving L increased LBP SKC, x3, increased pain on R when performing with LLE.   Standing: Requires min VCs throughout increase posture, required seated rest breaks for body posturing.  PWR Up: 2x10; demonstration and mod VCs to mimic sitting in a chair and  allow hips backwards and to open PWR Rock: 2x10; min VCs upward reach and improve weight shifts PWR Twist: 2x10; min VCs for body positioning and opening in the middle PWR Step: 2x10; min VCs for UE body positioning and increase step length.                             PT Education - 01/04/16 1258    Education provided Yes   Education Details HEP compliance, bed mobility, moving before exiting bed to decrease pain   Person(s) Educated Patient   Methods Explanation;Demonstration;Tactile cues;Verbal cues   Comprehension Verbalized understanding;Returned  demonstration;Verbal cues required;Tactile cues required             PT Long Term Goals - 12/21/15 1257      PT LONG TERM GOAL #1   Title Patient will be independent in HEP in order to increase patient's ability to maintain gains achieved in therapy and assist with return to PLOF.    Time 6   Period Weeks   Status On-going     PT LONG TERM GOAL #2   Title Patient will increase overall strength to 4/5 in BLE to increase ROM/strength throughout functional activities performed in his ADLs.    Time 6   Period Weeks   Status Partially Met     PT LONG TERM GOAL #3   Title Patient will be able to independently ascend/descend 5 steps with R unilateral hand rail in order to allow for safe entrance/exit from his home.    Time 6   Period Weeks   Status On-going     PT LONG TERM GOAL #4   Title Patient will decrease the time it takes to perform 5 times sit to stand to <14 seconds without UE use in order to decrease his risk for falls.     Time 6   Period Weeks   Status On-going     PT LONG TERM GOAL #5   Title Patient will increase his 10 m walk test time to >1.0 m/s in order to be a community ambulator at decreased risk for falls.    Time 6   Period Weeks   Status On-going     PT LONG TERM GOAL #6   Title Patient's Berg balance score will improve by 7 points in order to decrease patient's risk for falls and increase safety in his home.   Time 6   Period Weeks   Status Partially Met               Plan - 01/04/16 1300    Clinical Impression Statement Patient reports minimal back soreness today that increases to 9/10 with bed mobility but subsides to 1-2/10 pain afterwards. Patient was re-instructed in seated, standing and supine power exercises. He continues to have initial low back pain lying supine, but dissipates after lying that way for 2-3 minutes. Patient requires min-mod VCs to perform PWR exercises appropriately with improved opening, stepping, and increased  clapping. Patient would continue to benefit from skilled PT in order to address LE weakness, gait safety and balance, and reduce falls risk.    Rehab Potential Good   Clinical Impairments Affecting Rehab Potential Postive Factors: motivated, family support; Negative Factors: degenerative disease, age, chronic issues Clinical Presenstation: Evolving-falls risk, progressive disease   PT Frequency 2x / week   PT Duration 6 weeks   PT Treatment/Interventions ADLs/Self Care Home Management;Aquatic Therapy;Biofeedback;Cryotherapy;Electrical Stimulation;Moist Heat;Traction;DME Instruction;Gait  training;Stair training;Functional mobility training;Therapeutic activities;Therapeutic exercise;Balance training;Neuromuscular re-education;Patient/family education;Manual techniques;Passive range of motion;Energy conservation   PT Next Visit Plan continue with PWR exercises prone, Review and move to standing PWR exercises   PT Home Exercise Plan HEP progressed; patient now has seated, supine, and prone PWR exercises, qped PWR exercises   Consulted and Agree with Plan of Care Patient;Family member/caregiver   Family Member Consulted wife      Patient will benefit from skilled therapeutic intervention in order to improve the following deficits and impairments:  Decreased activity tolerance, Decreased balance, Decreased coordination, Decreased endurance, Decreased mobility, Decreased range of motion, Decreased safety awareness, Decreased strength, Difficulty walking, Hypomobility, Impaired flexibility, Impaired sensation, Improper body mechanics, Postural dysfunction, Pain  Visit Diagnosis: Unsteadiness on feet  Muscle weakness (generalized)  Abnormal posture     Problem List There are no active problems to display for this patient.  Tilman Neat, SPT This entire session was performed under direct supervision and direction of a licensed therapist/therapist assistant . I have personally read, edited and  approve of the note as written.  Trotter,Margaret PT, DPT 01/04/2016, 3:23 PM  [place] MAIN [facility] SERVICES [address] [place], [place], [address] Phone: [phone]   Fax:  [phone]  Name: Lisandro Meggett. MRN: [id number] Date of Birth: 10/23/1923

## 2016-01-06 ENCOUNTER — Encounter: Payer: Self-pay | Admitting: Physical Therapy

## 2016-01-06 ENCOUNTER — Ambulatory Visit: Payer: Medicare Other | Admitting: Physical Therapy

## 2016-01-06 DIAGNOSIS — R2681 Unsteadiness on feet: Secondary | ICD-10-CM

## 2016-01-06 DIAGNOSIS — M6281 Muscle weakness (generalized): Secondary | ICD-10-CM

## 2016-01-06 DIAGNOSIS — R293 Abnormal posture: Secondary | ICD-10-CM

## 2016-01-06 NOTE — Therapy (Signed)
[place] MAIN [facility] SERVICES [address] [place], [place], [address] Phone: [phone]   Fax:  [phone]  Physical Therapy Treatment  Patient Details  Name: Gavin Beltran. MRN: [id number] Date of Birth: 1923/08/22 Referring Provider: Dr. Manuella Ghazi  Encounter Date: 01/06/2016      PT End of Session - 01/06/16 1309    Visit Number 15   Number of Visits 29   Date for PT Re-Evaluation 02/01/16   Authorization Type G Code 5   Authorization Time Period 10   PT Start Time 1116   PT Stop Time 1201   PT Time Calculation (min) 45 min   Equipment Utilized During Treatment Gait belt   Activity Tolerance Patient limited by pain;Patient tolerated treatment well   Behavior During Therapy [facility] for tasks assessed/performed      Past Medical History:  Diagnosis Date  . Atrial fibrillation ([place])    controlled  . Hypertension    controlled by meds  . Parkinson disease ([place])     History reviewed. No pertinent surgical history.  There were no vitals filed for this visit.      Subjective Assessment - 01/06/16 1307    Subjective Patient states his LBP was 10/10 this am, but that it is very low right now.  Patient and wife deny falls. Patient notes he has been using rollator to get around now. He states he has been able to do his exercises at least once a day.     Patient is accompained by: Family member   Pertinent History Factors affecting Rehab: age, progressive disease, family support, limited activity level   Limitations Walking   How long can you sit comfortably? N/A   How long can you stand comfortably? patient states he is uncertain how to answer   How long can you walk comfortably? 120 feet   Patient Stated Goals improve balance because he has difficulty turning quickly    Currently in Pain? Yes   Pain Score 1    Pain Location Back   Pain Orientation Right;Lower   Pain Descriptors / Indicators Aching;Sore;Tightness      Treatment:  Treadmill training (BUE support throughout); CGA for safety, x5 min total. Forward walking at 1.8 mph for 3 minutes, min VCs to increase step length, perform hip/knee flexion in order to increase RLE advancement, and maintain upright posture.  Sideways walking, 0.5 mph, 1 min with each LE leading, mod-max VCs for when to perform step and to maintain stepping in order for safety, minA for stability.  Difficulty maintaining upright posture as time progressed.  PWR exercises; patient requires min VCs for initial positioning and improved speed throughout all exercises Supine: Position held for 2-3 minutes; LBP subsides. PWR Up: x10, PWR Rock: x10, PWR Twist: x10, min VCs to perform BUE extension to increase thoracic opening and rotation. PWR Step: x10, min VCs to increase movement range.   Prone: PWR Up: x10, min VCs to increase range PWR Rock: x10, PWR Step: x10, min VCs to increase lifting of UEs PWR Twist: x10, min VCs to open chest more to decrease stress on UE and improve technique  Seated PWR flow: Seated flow, x5 with demonstration and min VCs to initiate movement and increase seated PWR step.  Standing (initated into HEP): Requires min VCs throughout to increase posture, required 2 seated rest breaks for body posturing.  PWR Up: 2x10; demonstration and mod-max VCs to mimic sitting in a chair and allow hips backwards and  BUE shoulder HABD at end of motion. PWR Rock: 2x10; min VCs upward reach upwards with ipsilateral hand and improve weight shifts PWR Twist: 2x10; min VCs for body positioning and opening in the middle PWR Step: 2x10; min VCs for UE body positioning and increase step length.                             PT Education - 01/06/16 1309    Education provided Yes   Education Details HEP progressed and compliance, safety with mobility   Person(s) Educated Patient   Methods Explanation;Demonstration;Tactile cues;Verbal cues;Handout    Comprehension Verbalized understanding;Returned demonstration;Verbal cues required             PT Long Term Goals - 12/21/15 1257      PT LONG TERM GOAL #1   Title Patient will be independent in HEP in order to increase patient's ability to maintain gains achieved in therapy and assist with return to PLOF.    Time 6   Period Weeks   Status On-going     PT LONG TERM GOAL #2   Title Patient will increase overall strength to 4/5 in BLE to increase ROM/strength throughout functional activities performed in his ADLs.    Time 6   Period Weeks   Status Partially Met     PT LONG TERM GOAL #3   Title Patient will be able to independently ascend/descend 5 steps with R unilateral hand rail in order to allow for safe entrance/exit from his home.    Time 6   Period Weeks   Status On-going     PT LONG TERM GOAL #4   Title Patient will decrease the time it takes to perform 5 times sit to stand to <14 seconds without UE use in order to decrease his risk for falls.     Time 6   Period Weeks   Status On-going     PT LONG TERM GOAL #5   Title Patient will increase his 10 m walk test time to >1.0 m/s in order to be a community ambulator at decreased risk for falls.    Time 6   Period Weeks   Status On-going     PT LONG TERM GOAL #6   Title Patient's Berg balance score will improve by 7 points in order to decrease patient's risk for falls and increase safety in his home.   Time 6   Period Weeks   Status Partially Met               Plan - 01/06/16 1310    Clinical Impression Statement Patient reports minimal back pain today, which only increases to a 3/10 with sitting up during bed mobility and sit to stands. Patient able to perform PWR exercises with min VCs for body positioning and initiation of each exercise. Patient able to perform forward and sideways walking on treadmill. Sideways walking was very difficult and required mod-max VCs to know when to perform stepping  technique. Patient would continue to benefit from skilled PT in order to address LE weakness, LBP, functional safety, and reduce risk for falls.    Rehab Potential Good   Clinical Impairments Affecting Rehab Potential Postive Factors: motivated, family support; Negative Factors: degenerative disease, age, chronic issues Clinical Presenstation: Evolving-falls risk, progressive disease   PT Frequency 2x / week   PT Duration 6 weeks   PT Treatment/Interventions ADLs/Self Care Home Management;Aquatic Therapy;Biofeedback;Cryotherapy;Electrical Stimulation;Moist Heat;Traction;DME Instruction;Gait  training;Stair training;Functional mobility training;Therapeutic activities;Therapeutic exercise;Balance training;Neuromuscular re-education;Patient/family education;Manual techniques;Passive range of motion;Energy conservation   PT Next Visit Plan continue with PWR exercises prone, Review and move to standing PWR exercises   PT Home Exercise Plan HEP progressed; patient now has seated, supine, and prone PWR exercises, standing PWR exercises   Consulted and Agree with Plan of Care Patient;Family member/caregiver   Family Member Consulted wife      Patient will benefit from skilled therapeutic intervention in order to improve the following deficits and impairments:  Decreased activity tolerance, Decreased balance, Decreased coordination, Decreased endurance, Decreased mobility, Decreased range of motion, Decreased safety awareness, Decreased strength, Difficulty walking, Hypomobility, Impaired flexibility, Impaired sensation, Improper body mechanics, Postural dysfunction, Pain  Visit Diagnosis: Unsteadiness on feet  Muscle weakness (generalized)  Abnormal posture     Problem List There are no active problems to display for this patient.  Tilman Neat, SPT This entire session was performed under direct supervision and direction of a licensed therapist/therapist assistant . I have personally read, edited  and approve of the note as written.  Trotter,Margaret PT, DPT 01/06/2016, 4:10 PM  [place] MAIN [facility] SERVICES [address] [place], [place], [address] Phone: [phone]   Fax:  [phone]  Name: Gavin Beltran. MRN: [id number] Date of Birth: 01/23/1924

## 2016-01-10 ENCOUNTER — Ambulatory Visit: Payer: Medicare Other | Admitting: Physical Therapy

## 2016-01-12 ENCOUNTER — Encounter: Payer: Self-pay | Admitting: Physical Therapy

## 2016-01-12 ENCOUNTER — Ambulatory Visit: Payer: Medicare Other | Admitting: Physical Therapy

## 2016-01-12 DIAGNOSIS — R2681 Unsteadiness on feet: Secondary | ICD-10-CM | POA: Diagnosis not present

## 2016-01-12 DIAGNOSIS — M6281 Muscle weakness (generalized): Secondary | ICD-10-CM

## 2016-01-12 DIAGNOSIS — R293 Abnormal posture: Secondary | ICD-10-CM

## 2016-01-12 NOTE — Therapy (Signed)
[place] MAIN [facility] SERVICES [address] [place], [place], [address] Phone: [phone]   Fax:  [phone]  Physical Therapy Treatment  Patient Details  Name: Gavin Beltran. MRN: [id number] Date of Birth: 01-Oct-1923 Referring Provider: Dr. Manuella Ghazi  Encounter Date: 01/12/2016      PT End of Session - 01/12/16 1231    Visit Number 16   Number of Visits 29   Date for PT Re-Evaluation 02/01/16   Authorization Type G Code 6   Authorization Time Period 10   PT Start Time 1121   PT Stop Time 1200   PT Time Calculation (min) 39 min   Equipment Utilized During Treatment Gait belt   Activity Tolerance Patient tolerated treatment well   Behavior During Therapy WFL for tasks assessed/performed      Past Medical History:  Diagnosis Date  . Atrial fibrillation ([place])    controlled  . Hypertension    controlled by meds  . Parkinson disease ([place])     History reviewed. No pertinent surgical history.  There were no vitals filed for this visit.      Subjective Assessment - 01/12/16 1229    Subjective Patient reports his back is "tender" today but only rates his pain at a 2/10 with most pain going from sit to stand.  Wife reports he fell on his porch when he was not using his rollator since last visit.     Patient is accompained by: Family member   Pertinent History Factors affecting Rehab: age, progressive disease, family support, limited activity level   Limitations Walking   How long can you sit comfortably? N/A   How long can you stand comfortably? patient states he is uncertain how to answer   How long can you walk comfortably? 120 feet   Patient Stated Goals improve balance because he has difficulty turning quickly    Currently in Pain? Yes   Pain Score 2    Pain Location Back   Pain Orientation Right;Lower   Pain Descriptors / Indicators Tender;Aching     Treatment  Supine Power:  Position held for 4 minutes to stretch low  back and allow head to lie closer to mat  PWR Up: x10, min VCs for initial positioning  PWR Rock: x10, min VCs to increase reach towards target PWR Twist: x10, min VCs to increase trunk rotation  PWR Step: x10, min VCs to increase glut activation throughout   Prone: PWR Up: x10, min VCs to increase range and relax cervical spine  PWR Rock: x10, min to increase rotation and reaching PWR Step: x10, min VCs for positioning and increased thoracic rotation  PWR Twist: x10, min VCs to increase lateral range   Seated PWR flow: Seated flow, x 10 with PT demonstrating throughout, pt was able to increase speed with increased repetitions, min VCs throughout for open hands and bigger movements   Standing: PWR Up: x10; min VCs for hand positioning and decreased squat range with increased upright posture  PWR Rock: x10; min VCs for increased weight shift and increased reach towards ceiling  PWR Twist: x10; min VCs to increase trunk rotation and more emphasized clap  PWR Step: x10; mod cues from demonstration and min VCs for larger step and hand positioning                              PT Education - 01/12/16 1230  Education provided Yes   Education Details safety with transfers using RW    Person(s) Educated Patient   Methods Explanation;Demonstration;Verbal cues   Comprehension Verbalized understanding;Returned demonstration;Verbal cues required             PT Long Term Goals - 12/21/15 1257      PT LONG TERM GOAL #1   Title Patient will be independent in HEP in order to increase patient's ability to maintain gains achieved in therapy and assist with return to PLOF.    Time 6   Period Weeks   Status On-going     PT LONG TERM GOAL #2   Title Patient will increase overall strength to 4/5 in BLE to increase ROM/strength throughout functional activities performed in his ADLs.    Time 6   Period Weeks   Status Partially Met     PT LONG TERM GOAL #3   Title  Patient will be able to independently ascend/descend 5 steps with R unilateral hand rail in order to allow for safe entrance/exit from his home.    Time 6   Period Weeks   Status On-going     PT LONG TERM GOAL #4   Title Patient will decrease the time it takes to perform 5 times sit to stand to <14 seconds without UE use in order to decrease his risk for falls.     Time 6   Period Weeks   Status On-going     PT LONG TERM GOAL #5   Title Patient will increase his 10 m walk test time to >1.0 m/s in order to be a community ambulator at decreased risk for falls.    Time 6   Period Weeks   Status On-going     PT LONG TERM GOAL #6   Title Patient's Berg balance score will improve by 7 points in order to decrease patient's risk for falls and increase safety in his home.   Time 6   Period Weeks   Status Partially Met               Plan - 01/12/16 1233    Clinical Impression Statement Pt reports his back was more tender today potentially due to his recent fall.  He reported 2/10 at beginning of session and reported no pain at end of session.  Pt seemed to require more verbal cues today for hand placement and body positioning when changing positions for power exercises today.  Pt demonstrated most difficulty with prone power reaching activities due to disomfort in his shoulders.  He required min VCs and frequent demonstration to recall standing power but was able to perform proper technique after demonstrations.  He would benefit from further skilled PT to work on LE weakness, safety with gait in hopes to decrease falls.     Rehab Potential Good   Clinical Impairments Affecting Rehab Potential Postive Factors: motivated, family support; Negative Factors: degenerative disease, age, chronic issues Clinical Presenstation: Evolving-falls risk, progressive disease   PT Frequency 2x / week   PT Duration 6 weeks   PT Treatment/Interventions ADLs/Self Care Home Management;Aquatic  Therapy;Biofeedback;Cryotherapy;Electrical Stimulation;Moist Heat;Traction;DME Instruction;Gait training;Stair training;Functional mobility training;Therapeutic activities;Therapeutic exercise;Balance training;Neuromuscular re-education;Patient/family education;Manual techniques;Passive range of motion;Energy conservation   PT Next Visit Plan initate qudruped and floor to stand transfer    PT Home Exercise Plan HEP progressed; patient now has seated, supine, and prone PWR exercises, standing PWR exercises   Consulted and Agree with Plan of Care Patient;Family member/caregiver   Family  Member Consulted wife      Patient will benefit from skilled therapeutic intervention in order to improve the following deficits and impairments:  Decreased activity tolerance, Decreased balance, Decreased coordination, Decreased endurance, Decreased mobility, Decreased range of motion, Decreased safety awareness, Decreased strength, Difficulty walking, Hypomobility, Impaired flexibility, Impaired sensation, Improper body mechanics, Postural dysfunction, Pain  Visit Diagnosis: Unsteadiness on feet  Muscle weakness (generalized)  Abnormal posture     Problem List There are no active problems to display for this patient.  Stacy Gardner, SPT  This entire session was performed under direct supervision and direction of a licensed therapist/therapist assistant . I have personally read, edited and approve of the note as written.  Trotter,Margaret PT, DPT 01/13/2016, 1:03 PM   [place] MAIN [facility] SERVICES [address] [place], [place], [address] Phone: [phone]   Fax:  [phone]  Name: Eythan Jayne. MRN: [id number] Date of Birth: [birth year]

## 2016-01-17 ENCOUNTER — Ambulatory Visit: Payer: Medicare Other | Admitting: Podiatry

## 2016-01-19 ENCOUNTER — Ambulatory Visit: Payer: Medicare Other | Admitting: Physical Therapy

## 2016-01-19 ENCOUNTER — Encounter: Payer: Self-pay | Admitting: Physical Therapy

## 2016-01-19 DIAGNOSIS — R2681 Unsteadiness on feet: Secondary | ICD-10-CM | POA: Diagnosis not present

## 2016-01-19 DIAGNOSIS — M6281 Muscle weakness (generalized): Secondary | ICD-10-CM

## 2016-01-19 DIAGNOSIS — R293 Abnormal posture: Secondary | ICD-10-CM

## 2016-01-19 NOTE — Therapy (Signed)
[place] MAIN [facility] SERVICES [address] [place], [place], [address] Phone: [phone]   Fax:  [phone]  Physical Therapy Treatment  Patient Details  Name: Gavin Beltran. MRN: [id number] Date of Birth: 1923/06/21 Referring Provider: Dr. Manuella Ghazi  Encounter Date: 01/19/2016      PT End of Session - 01/19/16 1441    Visit Number 17   Number of Visits 29   Date for PT Re-Evaluation 02/01/16   Authorization Type G Code 7   Authorization Time Period 10   PT Start Time 1305   PT Stop Time 1347   PT Time Calculation (min) 42 min   Equipment Utilized During Treatment Gait belt   Activity Tolerance Patient tolerated treatment well   Behavior During Therapy WFL for tasks assessed/performed      Past Medical History:  Diagnosis Date  . Atrial fibrillation ([place])    controlled  . Hypertension    controlled by meds  . Parkinson disease ([place])     History reviewed. No pertinent surgical history.  There were no vitals filed for this visit.      Subjective Assessment - 01/19/16 1308    Subjective Patient notes his back has been tender today, which he feels when moving  to/from sitting and standing but decreases once sitting or standing. Denies falls. He states he has only been able to walk and not do any other exercises.    Patient is accompained by: Family member   Pertinent History Factors affecting Rehab: age, progressive disease, family support, limited activity level   Limitations Walking   How long can you sit comfortably? N/A   How long can you stand comfortably? patient states he is uncertain how to answer   How long can you walk comfortably? 120 feet   Patient Stated Goals improve balance because he has difficulty turning quickly    Currently in Pain? Yes   Pain Score 1    Pain Location Back   Pain Orientation Right;Lower   Pain Descriptors / Indicators Aching;Tender   Pain Type Chronic pain     Treatment: Treadmill  training, x4 minutes at 1.8 mph, min VCs to improve posture and advance RLE, CGA for safety, 2 hands on bar. Firm surface sideways walking with turns at each cone (x4 cones), Min VCs to perform with open arms and demonstration and VCs to increase swing of contralateral LE when turning opposite direction to decrease freezing, x6 total, rest breaks every 2 to decrease forward/slumped posture. With transitions from exercises and lateral walking, patient requires minA x2 in order to maintain balance.  Supine PWR: Instructed to lay supine to increase trunk opening for 2-3 min.; patient noted LBP subsided. PWR UP: x10, min VCs for initial set up [employer]: x10, min VCs for initial set up and reaching PWR Twist: x10, min VCs to increase rolling and count out loud PWR transition: x20, min VCs for initial set up and increased hip flexion range. Instructed patient to transition into prone and then into qped, minA to move into qped and demonstration required. Qped weight shifts forward/backward, x10 Pt noted mild UE fatigue, instructed to sit back onto heels and rest Qped weight shifts laterally, x3, patient stated he thought his food was coming up, denies chest pain or nausea.  No further exercises completed due to feeling of food coming up.  PT Education - 01/19/16 1440    Education provided Yes   Education Details using rollator or cane at all times inside/outside, POC   Person(s) Educated Patient;Spouse   Methods Explanation   Comprehension Verbalized understanding             PT Long Term Goals - 12/21/15 1257      PT LONG TERM GOAL #1   Title Patient will be independent in HEP in order to increase patient's ability to maintain gains achieved in therapy and assist with return to PLOF.    Time 6   Period Weeks   Status On-going     PT LONG TERM GOAL #2   Title Patient will increase overall strength to 4/5 in BLE to increase  ROM/strength throughout functional activities performed in his ADLs.    Time 6   Period Weeks   Status Partially Met     PT LONG TERM GOAL #3   Title Patient will be able to independently ascend/descend 5 steps with R unilateral hand rail in order to allow for safe entrance/exit from his home.    Time 6   Period Weeks   Status On-going     PT LONG TERM GOAL #4   Title Patient will decrease the time it takes to perform 5 times sit to stand to <14 seconds without UE use in order to decrease his risk for falls.     Time 6   Period Weeks   Status On-going     PT LONG TERM GOAL #5   Title Patient will increase his 10 m walk test time to >1.0 m/s in order to be a community ambulator at decreased risk for falls.    Time 6   Period Weeks   Status On-going     PT LONG TERM GOAL #6   Title Patient's Berg balance score will improve by 7 points in order to decrease patient's risk for falls and increase safety in his home.   Time 6   Period Weeks   Status Partially Met               Plan - 01/19/16 1441    Clinical Impression Statement Pt notes his back is slightly more tender today but mostly with moving from/to sit<>stand. He is able to participate in the treadmill, lateral sidestepping with turns, and Supine PWR exercises. During qped, the patient notes he was feeling fine except he thought his lunch may start to come up. The patient was instructed to sit  then move to supine. Reports he still feels the same way, he was instructed to sit up and was given a swallow of water which he noted helped. He denies chest pain or any pain or nausea. Sat for 2-3 minutes until mostly subsided. Informed wife at performing PT until Oct. and then hopefully transitioning him to a PWR class.    Rehab Potential Good   Clinical Impairments Affecting Rehab Potential Postive Factors: motivated, family support; Negative Factors: degenerative disease, age, chronic issues Clinical Presenstation: Evolving-falls  risk, progressive disease   PT Frequency 2x / week   PT Duration 6 weeks   PT Treatment/Interventions ADLs/Self Care Home Management;Aquatic Therapy;Biofeedback;Cryotherapy;Electrical Stimulation;Moist Heat;Traction;DME Instruction;Gait training;Stair training;Functional mobility training;Therapeutic activities;Therapeutic exercise;Balance training;Neuromuscular re-education;Patient/family education;Manual techniques;Passive range of motion;Energy conservation   PT Next Visit Plan initate qudruped and floor to stand transfer    PT Home Exercise Plan HEP progressed; patient now has seated, supine, and prone PWR exercises, standing PWR exercises  Consulted and Agree with Plan of Care Patient;Family member/caregiver   Family Member Consulted wife      Patient will benefit from skilled therapeutic intervention in order to improve the following deficits and impairments:  Decreased activity tolerance, Decreased balance, Decreased coordination, Decreased endurance, Decreased mobility, Decreased range of motion, Decreased safety awareness, Decreased strength, Difficulty walking, Hypomobility, Impaired flexibility, Impaired sensation, Improper body mechanics, Postural dysfunction, Pain  Visit Diagnosis: Unsteadiness on feet  Muscle weakness (generalized)  Abnormal posture     Problem List There are no active problems to display for this patient.  Tilman Neat, SPT This entire session was performed under direct supervision and direction of a licensed therapist/therapist assistant . I have personally read, edited and approve of the note as written.  Trotter,Margaret PT, DPT 01/20/2016, 9:00 AM  [place] MAIN [facility] SERVICES [address] [place], [place], [address] Phone: [phone]   Fax:  [phone]  Name: Gavin Beltran. MRN: [id number] Date of Birth: August 11, 1923

## 2016-01-24 ENCOUNTER — Ambulatory Visit: Payer: Medicare Other | Admitting: Physical Therapy

## 2016-01-24 ENCOUNTER — Encounter: Payer: Self-pay | Admitting: Physical Therapy

## 2016-01-24 DIAGNOSIS — R2681 Unsteadiness on feet: Secondary | ICD-10-CM

## 2016-01-24 DIAGNOSIS — R293 Abnormal posture: Secondary | ICD-10-CM

## 2016-01-24 DIAGNOSIS — M6281 Muscle weakness (generalized): Secondary | ICD-10-CM

## 2016-01-24 NOTE — Therapy (Signed)
[place] MAIN [facility] SERVICES [address] [place], [place], [address] Phone: [phone]   Fax:  [phone]  Physical Therapy Treatment  Patient Details  Name: Gavin Beltran. MRN: [id number] Date of Birth: 1923/08/29 Referring Provider: Dr. Manuella Ghazi  Encounter Date: 01/24/2016      PT End of Session - 01/24/16 1608    Visit Number 18   Number of Visits 29   Date for PT Re-Evaluation 02/01/16   Authorization Type G Code 8   Authorization Time Period 10   PT Start Time 1300   PT Stop Time 1349   PT Time Calculation (min) 49 min   Equipment Utilized During Treatment Gait belt   Activity Tolerance Patient tolerated treatment well   Behavior During Therapy WFL for tasks assessed/performed      Past Medical History:  Diagnosis Date  . Atrial fibrillation ([place])    controlled  . Hypertension    controlled by meds  . Parkinson disease ([place])     History reviewed. No pertinent surgical history.  There were no vitals filed for this visit.      Subjective Assessment - 01/24/16 1439    Subjective Pt notes his back was a 12/10 this am but once he straightened up he felt better. He denies any recent falls. His wife and him state that he is starting to be more steady.    Patient is accompained by: Family member   Pertinent History Factors affecting Rehab: age, progressive disease, family support, limited activity level   Limitations Walking   How long can you sit comfortably? N/A   How long can you stand comfortably? patient states he is uncertain how to answer   How long can you walk comfortably? 120 feet   Patient Stated Goals improve balance because he has difficulty turning quickly    Currently in Pain? Yes   Pain Score 1    Pain Location Back   Pain Orientation Right   Pain Descriptors / Indicators Aching      Treatment:  Treadmill training, x3 min at 1.8 mph, 2 HHA on treadmill, min VCs to increase step length and maintain  this for the whole time.   Instructed in floor to chair and back transfers on red mat: Instructed to move from sidelying to qped to half kneel to stand with chair assist; x1, min VCs for hand positioning and body positioning, tactile cues for body movement During 2nd attempt, patient had difficulty moving from sidelying to qped, instructed to lay supine then move into full prone, use hands under shoulders and walk them backwards while keeping his knees still, CGA-minA to move into qped from this position.   Forward walking around cones, x2 turning to R/L, min VCs to increase step length for safe turns.                                   PT Long Term Goals - 12/21/15 1257      PT LONG TERM GOAL #1   Title Patient will be independent in HEP in order to increase patient's ability to maintain gains achieved in therapy and assist with return to PLOF.    Time 6   Period Weeks   Status On-going     PT LONG TERM GOAL #2   Title Patient will increase overall strength to 4/5 in BLE to increase ROM/strength throughout functional activities performed  in his ADLs.    Time 6   Period Weeks   Status Partially Met     PT LONG TERM GOAL #3   Title Patient will be able to independently ascend/descend 5 steps with R unilateral hand rail in order to allow for safe entrance/exit from his home.    Time 6   Period Weeks   Status On-going     PT LONG TERM GOAL #4   Title Patient will decrease the time it takes to perform 5 times sit to stand to <14 seconds without UE use in order to decrease his risk for falls.     Time 6   Period Weeks   Status On-going     PT LONG TERM GOAL #5   Title Patient will increase his 10 m walk test time to >1.0 m/s in order to be a community ambulator at decreased risk for falls.    Time 6   Period Weeks   Status On-going     PT LONG TERM GOAL #6   Title Patient's Berg balance score will improve by 7 points in order to decrease patient's  risk for falls and increase safety in his home.   Time 6   Period Weeks   Status Partially Met               Plan - 01/24/16 1608    Clinical Impression Statement Pt and wife wanted to discuss POC and transitioning out of therapy. Pt and wife were instructed about PWR program and wanting him to transition to a community program in order to maintain his gains and stop progression. The pt was instructed on how to move from supine/prone to qped to standing with chair assist. Pt requires mod VCs and min tactile cues to perform, handout was given. Instructed to perform turns around cones x2. After performing patient has mild lightheadedness with BP at 107/67, instructed to move arms quickly to raise BP. He notes he feels better with no further lightheadedness. The pt would continue to benefit from skilled PT in order to address LBP, balance, safety with movement, and tranisition him to a community program.    Rehab Potential Good   Clinical Impairments Affecting Rehab Potential Postive Factors: motivated, family support; Negative Factors: degenerative disease, age, chronic issues Clinical Presenstation: Evolving-falls risk, progressive disease   PT Frequency 2x / week   PT Duration 6 weeks   PT Treatment/Interventions ADLs/Self Care Home Management;Aquatic Therapy;Biofeedback;Cryotherapy;Electrical Stimulation;Moist Heat;Traction;DME Instruction;Gait training;Stair training;Functional mobility training;Therapeutic activities;Therapeutic exercise;Balance training;Neuromuscular re-education;Patient/family education;Manual techniques;Passive range of motion;Energy conservation   PT Next Visit Plan initate qudruped and floor to stand transfer    PT Home Exercise Plan HEP maintained; patient now has seated, supine, and prone PWR exercises, standing PWR exercises   Consulted and Agree with Plan of Care Patient;Family member/caregiver   Family Member Consulted wife      Patient will benefit from  skilled therapeutic intervention in order to improve the following deficits and impairments:  Decreased activity tolerance, Decreased balance, Decreased coordination, Decreased endurance, Decreased mobility, Decreased range of motion, Decreased safety awareness, Decreased strength, Difficulty walking, Hypomobility, Impaired flexibility, Impaired sensation, Improper body mechanics, Postural dysfunction, Pain  Visit Diagnosis: Unsteadiness on feet  Muscle weakness (generalized)  Abnormal posture     Problem List There are no active problems to display for this patient.  Tilman Neat, SPT This entire session was performed under direct supervision and direction of a licensed therapist/therapist assistant . I have personally read, edited  and approve of the note as written.   Trotter,Margaret PT, DPT 01/24/2016, 4:39 PM  [place] MAIN [facility] SERVICES [address] [place], [place], [address] Phone: [phone]   Fax:  [phone]  Name: Gavin Beltran. MRN: [id number] Date of Birth: [birth year]

## 2016-01-27 ENCOUNTER — Encounter: Payer: Self-pay | Admitting: Physical Therapy

## 2016-01-27 ENCOUNTER — Ambulatory Visit: Payer: Medicare Other | Admitting: Physical Therapy

## 2016-01-27 DIAGNOSIS — R2681 Unsteadiness on feet: Secondary | ICD-10-CM | POA: Diagnosis not present

## 2016-01-27 DIAGNOSIS — R293 Abnormal posture: Secondary | ICD-10-CM

## 2016-01-27 DIAGNOSIS — M6281 Muscle weakness (generalized): Secondary | ICD-10-CM

## 2016-01-27 NOTE — Therapy (Signed)
[place] MAIN [facility] SERVICES [address] [place], [place], [address] Phone: [phone]   Fax:  [phone]  Physical Therapy Treatment  Patient Details  Name: Gavin Beltran. MRN: [id number] Date of Birth: 24-Aug-1923 Referring Provider: Dr. Manuella Ghazi  Encounter Date: 01/27/2016      PT End of Session - 01/27/16 1514    Visit Number 19   Number of Visits 29   Date for PT Re-Evaluation 02/01/16   Authorization Type G Code 9   Authorization Time Period 10   PT Start Time 1345   PT Stop Time 1438   PT Time Calculation (min) 53 min   Equipment Utilized During Treatment Gait belt   Activity Tolerance Patient tolerated treatment well   Behavior During Therapy WFL for tasks assessed/performed      Past Medical History:  Diagnosis Date  . Atrial fibrillation ([place])    controlled  . Hypertension    controlled by meds  . Parkinson disease ([place])     History reviewed. No pertinent surgical history.  There were no vitals filed for this visit.      Subjective Assessment - 01/27/16 1349    Subjective Denies falls; notes he has back pain in the am fairly bad. He states he is trying to walk but has not been doing his exercises like he should.    Patient is accompained by: Family member   Pertinent History Factors affecting Rehab: age, progressive disease, family support, limited activity level   Limitations Walking   How long can you sit comfortably? N/A   How long can you stand comfortably? patient states he is uncertain how to answer   How long can you walk comfortably? 120 feet   Patient Stated Goals improve balance because he has difficulty turning quickly    Currently in Pain? Yes   Pain Score 0-No pain   Pain Location Back     Treatment: Treadmill training, forward walking x3 min at 1.8 mph, lateral walking x1.5 min each direction at 0.2 mph, CGA at all times, 2 HHA on treadmill, min VCs to increase step length and maintain  upright posture and when to step during lateral movements.  During PWR exercises, the patient requires min-mod verbal/tactile cues for initial set up or improvement in performance; also encouraged throughout in order to maintain speed of movement. Supine Power:  Position heldfor 2 minutes to stretch low back and allow head to lie closer to mat  PWR Up: x10, min VCs for initial positioning   PWR Rock: x10, min VCs to perform quickly PWR Twist: x10, min VCs to increase trunk rotation and open in between rotations PWR Step: x10, min VCs for initial positioning  Prone: Position held for 2 min to decrease low back pain PWR Up: x10, min VCs to decrease range if painful PWR Rock: x10, tactile cues for increased reach PWR Step: x10, min VCs for initiation of exercise PWR Twist: x10, min verbal and tactile cueing for initial set up and continuation of exercise  Qped: PWR Up: x10, min VCs for initial set up and increasing speed [employer]: x5, min VCs for initial set up and to increase B hip extension Patient began feeling as if food was not digested all the way instructed to move from qped to half kneeling to standing; dissipated once sitting and small sips of water.  Firm surface sideways walking with turns at each cone (x4 cones), Min VCs to perform increase swing of contralateral  LE when turning opposite direction to decrease freezing, x4 total, rest breaks in between sets due to fatigue and poor posture. With transitions from exercises and lateral walking, patient requires minA in order to maintain balance, 1 loss of balance requiring SPT assist. Backwards walking, x2, min VCs to perform "push" with UEs and bending at hips for improve weight distribution and to increase step length, minA throughout, no loss of balance; rest in between sets.                           PT Education - 01/27/16 1514    Education provided Yes   Education Details using rollator, performing UE  movements quickly if lightheaded, and not performing activities with a saw   Person(s) Educated Patient   Methods Explanation   Comprehension Verbalized understanding             PT Long Term Goals - 12/21/15 1257      PT LONG TERM GOAL #1   Title Patient will be independent in HEP in order to increase patient's ability to maintain gains achieved in therapy and assist with return to PLOF.    Time 6   Period Weeks   Status On-going     PT LONG TERM GOAL #2   Title Patient will increase overall strength to 4/5 in BLE to increase ROM/strength throughout functional activities performed in his ADLs.    Time 6   Period Weeks   Status Partially Met     PT LONG TERM GOAL #3   Title Patient will be able to independently ascend/descend 5 steps with R unilateral hand rail in order to allow for safe entrance/exit from his home.    Time 6   Period Weeks   Status On-going     PT LONG TERM GOAL #4   Title Patient will decrease the time it takes to perform 5 times sit to stand to <14 seconds without UE use in order to decrease his risk for falls.     Time 6   Period Weeks   Status On-going     PT LONG TERM GOAL #5   Title Patient will increase his 10 m walk test time to >1.0 m/s in order to be a community ambulator at decreased risk for falls.    Time 6   Period Weeks   Status On-going     PT LONG TERM GOAL #6   Title Patient's Berg balance score will improve by 7 points in order to decrease patient's risk for falls and increase safety in his home.   Time 6   Period Weeks   Status Partially Met               Plan - 01/27/16 1515    Clinical Impression Statement Pt able to tolerate activities well today. However, during qped activities patient had a feeling like his food was coming up. He again denies pain or nausea. Once he was given a sip of water he was fine. He has mild lightheadedness after activities which resolves when instructed to perform quick UE movements; BP  slightly low at 109/61 O2 at 99%. Pt requires min-mod VCs for proper technique during activities. He was able to perform lateral and backward walking activities. Pt was asking if he could use a saw in his back lot, he was instructed to no do this due to his poor balance. The patient would continue to benefit from skilled PT  in order to address functional mobility, LBP, safety with movement, and transition him to a community program.   Rehab Potential Good   Clinical Impairments Affecting Rehab Potential Postive Factors: motivated, family support; Negative Factors: degenerative disease, age, chronic issues Clinical Presenstation: Evolving-falls risk, progressive disease   PT Frequency 2x / week   PT Duration 6 weeks   PT Treatment/Interventions ADLs/Self Care Home Management;Aquatic Therapy;Biofeedback;Cryotherapy;Electrical Stimulation;Moist Heat;Traction;DME Instruction;Gait training;Stair training;Functional mobility training;Therapeutic activities;Therapeutic exercise;Balance training;Neuromuscular re-education;Patient/family education;Manual techniques;Passive range of motion;Energy conservation   PT Next Visit Plan initate qudruped and floor to stand transfer    PT Home Exercise Plan HEP maintained; patient now has seated, supine, prone PWR exercises, and standing PWR exercises   Consulted and Agree with Plan of Care Patient;Family member/caregiver   Family Member Consulted wife      Patient will benefit from skilled therapeutic intervention in order to improve the following deficits and impairments:  Decreased activity tolerance, Decreased balance, Decreased coordination, Decreased endurance, Decreased mobility, Decreased range of motion, Decreased safety awareness, Decreased strength, Difficulty walking, Hypomobility, Impaired flexibility, Impaired sensation, Improper body mechanics, Postural dysfunction, Pain  Visit Diagnosis: Unsteadiness on feet  Muscle weakness (generalized)  Abnormal  posture     Problem List There are no active problems to display for this patient.  Tilman Neat, SPT This entire session was performed under direct supervision and direction of a licensed therapist/therapist assistant . I have personally read, edited and approve of the note as written.  Trotter,Margaret PT, DPT 01/27/2016, 3:56 PM  [place] MAIN [facility] SERVICES [address] [place], [place], [address] Phone: [phone]   Fax:  [phone]  Name: Gavin Beltran. MRN: [id number] Date of Birth: 20-Jan-1924

## 2016-02-01 ENCOUNTER — Ambulatory Visit: Payer: Medicare Other | Attending: Neurology | Admitting: Physical Therapy

## 2016-02-01 ENCOUNTER — Encounter: Payer: Self-pay | Admitting: Physical Therapy

## 2016-02-01 DIAGNOSIS — R293 Abnormal posture: Secondary | ICD-10-CM

## 2016-02-01 DIAGNOSIS — M6281 Muscle weakness (generalized): Secondary | ICD-10-CM | POA: Diagnosis present

## 2016-02-01 DIAGNOSIS — R2681 Unsteadiness on feet: Secondary | ICD-10-CM | POA: Diagnosis present

## 2016-02-01 NOTE — Therapy (Signed)
[place] MAIN [facility] SERVICES [address] [place], [place], [address] Phone: [phone]   Fax:  [phone]  Physical Therapy Treatment/Progress Note  Patient Details  Name: Gavin Beltran. MRN: [id number] Date of Birth: 01-14-1924 Referring Provider: Dr. Manuella Ghazi  Encounter Date: 02/01/2016      PT End of Session - 02/01/16 1730    Visit Number 20   Number of Visits 33   Date for PT Re-Evaluation 02/29/16   Authorization Type G Code 10   Authorization Time Period 10   PT Start Time 6333   PT Stop Time 1616   PT Time Calculation (min) 46 min   Equipment Utilized During Treatment Gait belt   Activity Tolerance Patient tolerated treatment well   Behavior During Therapy WFL for tasks assessed/performed      Past Medical History:  Diagnosis Date  . Atrial fibrillation ([place])    controlled  . Hypertension    controlled by meds  . Parkinson disease ([place])     History reviewed. No pertinent surgical history.  There were no vitals filed for this visit.      Subjective Assessment - 02/01/16 1531    Subjective Denies falls but states he can't remember all the time if he has them. He states he didn't do any of his exercises. He states he has been walking some. He states his back pain has been good today with only a little pain during sitting and standing.    Patient is accompained by: Family member   Pertinent History Factors affecting Rehab: age, progressive disease, family support, limited activity level   Limitations Walking   How long can you sit comfortably? N/A   How long can you stand comfortably? patient states he is uncertain how to answer   How long can you walk comfortably? 120 feet   Patient Stated Goals improve balance because he has difficulty turning quickly    Currently in Pain? No/denies            [facility] PT Assessment - 02/02/16 0001      Strength   Right Hip Flexion 4-/5   Right Hip ABduction 5/5   Right  Hip ADduction 5/5   Left Hip Flexion 4-/5   Left Hip ABduction 5/5   Left Hip ADduction 5/5   Right Knee Flexion 4/5   Right Knee Extension 4+/5   Left Knee Flexion 4/5   Left Knee Extension 4+/5   Right Ankle Dorsiflexion 4+/5   Left Ankle Dorsiflexion 4+/5     Standardized Balance Assessment   Five times sit to stand comments  16.0, with BUE, decrease from 12/22/15 at 28.6s, >60 y.o. >15s increased risk for falls   10 Meter Walk 0.87ms, no AD, CGA, improved from 12/22/15 at 0.63 m/s, limited community ambulator     [employer]  Sit to Stand Able to stand  independently using hands   Standing Unsupported Able to stand safely 2 minutes   Sitting with Back Unsupported but Feet Supported on Floor or Stool Able to sit safely and securely 2 minutes   Stand to Sit Controls descent by using hands   Transfers Able to transfer safely, definite need of hands   Standing Unsupported with Eyes Closed Able to stand 10 seconds safely   Standing Ubsupported with Feet Together Able to place feet together independently and stand 1 minute safely   From Standing, Reach Forward with Outstretched Arm Can reach forward >12 cm safely (  5")   From Standing Position, Pick up Object from [place] to pick up shoe safely and easily   From Standing Position, Turn to Look Behind Over each Shoulder Looks behind one side only/other side shows less weight shift   Turn 360 Degrees Able to turn 360 degrees safely but slowly   Standing Unsupported, Alternately Place Feet on Step/Stool Able to stand independently and complete 8 steps >20 seconds   Standing Unsupported, One Foot in Front Able to plae foot ahead of the other independently and hold 30 seconds   Standing on One Leg Able to lift leg independently and hold equal to or more than 3 seconds   Total Score 45   Berg comment: Moderate risk >50% in range of 46-51 Improved from 43/45 on 12/22/15     Timed Up and Go Test   Normal TUG (seconds) 15   TUG  Comments CGA, no AD, improved from 7/17 at 15.4 seconds      Treatment:  Warm up on NuStep, L3 x4 min; unbilled  Instructed patient to perform outcome measures including MMT, 10 meter walk test, 5x sit to stand, and Berg balance. Patient requires min VCs throughout and rest breaks in order to improve poor posture while standing; CGA for safety with minA during turns.   Re-instructed about importance of performing HEP.   Min VCs to perform transition to chair with rollator by turning it, walking backwards, and locking the brakes before sitting.                         PT Education - 02/01/16 1729    Education provided Yes   Education Details POC, correct use of rollator, PWR classes   Person(s) Educated Patient;Spouse   Methods Explanation   Comprehension Verbalized understanding             PT Long Term Goals - 02/01/16 1731      PT LONG TERM GOAL #1   Title Patient will be independent in HEP in order to increase patient's ability to maintain gains achieved in therapy and assist with return to PLOF.    Time 4   Period Weeks   Status On-going     PT LONG TERM GOAL #2   Title Patient will increase overall strength to 4/5 in BLE to increase ROM/strength throughout functional activities performed in his ADLs.    Time 4   Period Weeks   Status Partially Met     PT LONG TERM GOAL #3   Title Patient will be able to independently ascend/descend 5 steps with R unilateral hand rail in order to allow for safe entrance/exit from his home.    Time 4   Period Weeks   Status On-going     PT LONG TERM GOAL #4   Title Patient will decrease the time it takes to perform 5 times sit to stand to <14 seconds without UE use in order to decrease his risk for falls.     Time 4   Period Weeks   Status Partially Met     PT LONG TERM GOAL #5   Title Patient will increase his 10 m walk test time to >1.0 m/s in order to be a community ambulator at decreased risk for  falls.    Time 4   Period Weeks   Status Partially Met     PT LONG TERM GOAL #6   Title Patient's Berg balance score will improve by  7 points in order to decrease patient's risk for falls and increase safety in his home.   Time 4   Period Weeks   Status Partially Met               Plan - 02-28-2016 1731    Clinical Impression Statement Pt's goals and outcome measures were assessed, the patient is making progress towards his goals and shows improvement in his safety with no recent falls. The patient still is unsteady on his feet especially during turning. The pt and his spouse was instructed about the plan of care and the importance of performing HEP. Pt would continue to benefit from skilled PT in order to decrease his risk for falls, improve safety during functional mobility, and address LBP.    Rehab Potential Good   Clinical Impairments Affecting Rehab Potential Postive Factors: motivated, family support; Negative Factors: degenerative disease, age, chronic issues Clinical Presenstation: Evolving-falls risk, progressive disease   PT Frequency 1x / week   PT Duration 4 weeks   PT Treatment/Interventions ADLs/Self Care Home Management;Aquatic Therapy;Biofeedback;Cryotherapy;Electrical Stimulation;Moist Heat;Traction;DME Instruction;Gait training;Stair training;Functional mobility training;Therapeutic activities;Therapeutic exercise;Balance training;Neuromuscular re-education;Patient/family education;Manual techniques;Passive range of motion;Energy conservation   PT Next Visit Plan initate qudruped and floor to stand transfer    PT Home Exercise Plan HEP maintained; patient now has seated, supine, prone PWR exercises, and standing PWR exercises   Consulted and Agree with Plan of Care Patient;Family member/caregiver   Family Member Consulted wife      Patient will benefit from skilled therapeutic intervention in order to improve the following deficits and impairments:  Decreased  activity tolerance, Decreased balance, Decreased coordination, Decreased endurance, Decreased mobility, Decreased range of motion, Decreased safety awareness, Decreased strength, Difficulty walking, Hypomobility, Impaired flexibility, Impaired sensation, Improper body mechanics, Postural dysfunction, Pain  Visit Diagnosis: Unsteadiness on feet - Plan: PT plan of care cert/re-cert  Muscle weakness (generalized) - Plan: PT plan of care cert/re-cert  Abnormal posture - Plan: PT plan of care cert/re-cert       G-Codes - February 28, 2016 1700    Functional Assessment Tool Used 10 meter walk, 5 times sit<>Stand, clinical judgement   Functional Limitation Mobility: Walking and moving around   Mobility: Walking and Moving Around Current Status (X9147) At least 40 percent but less than 60 percent impaired, limited or restricted   Mobility: Walking and Moving Around Goal Status (W2956) At least 20 percent but less than 40 percent impaired, limited or restricted      Problem List There are no active problems to display for this patient.  Tilman Neat, SPT This entire session was performed under direct supervision and direction of a licensed therapist/therapist assistant . I have personally read, edited and approve of the note as written.  Trotter,Margaret PT, DPT 02/02/2016, 9:35 AM  [place] MAIN [facility] SERVICES [address] [place], [place], [address] Phone: [phone]   Fax:  [phone]  Name: Gavin Beltran. MRN: [id number] Date of Birth: [birth year]

## 2016-02-07 ENCOUNTER — Ambulatory Visit (INDEPENDENT_AMBULATORY_CARE_PROVIDER_SITE_OTHER): Payer: Medicare Other | Admitting: Podiatry

## 2016-02-07 ENCOUNTER — Encounter: Payer: Self-pay | Admitting: Podiatry

## 2016-02-07 DIAGNOSIS — B351 Tinea unguium: Secondary | ICD-10-CM | POA: Diagnosis not present

## 2016-02-07 DIAGNOSIS — M79676 Pain in unspecified toe(s): Secondary | ICD-10-CM

## 2016-02-08 ENCOUNTER — Ambulatory Visit: Payer: Medicare Other | Admitting: Physical Therapy

## 2016-02-08 ENCOUNTER — Encounter: Payer: Self-pay | Admitting: Physical Therapy

## 2016-02-08 DIAGNOSIS — R2681 Unsteadiness on feet: Secondary | ICD-10-CM

## 2016-02-08 DIAGNOSIS — M6281 Muscle weakness (generalized): Secondary | ICD-10-CM

## 2016-02-08 DIAGNOSIS — R293 Abnormal posture: Secondary | ICD-10-CM

## 2016-02-08 NOTE — Progress Notes (Signed)
He presents today with chief complaint of painful elongated toenails.  Objective: Vital signs are stable he is alert and oriented 3. Pulsatile. His toenails are thick yellow dystrophic mycotic and painful palpation.  Assessment: Pain and limited secondary to onychomycosis 1 through 5 bilateral.  Plan: Treatment toenails 1 through 5 bilateral. Follow up with him in 3 months.

## 2016-02-09 NOTE — Therapy (Signed)
[place] [facility] MAIN [facility] SERVICES [address] [place], [place], [address] Phone: [phone]   Fax:  [phone]  Physical Therapy Treatment  Patient Details  Name: Gavin Beltran. MRN: [id number] Date of Birth: [birth year] Referring Provider: Dr. Sherryll Burger  Encounter Date: 02/08/2016      PT End of Session - 02/08/16 1554    Visit Number 21   Number of Visits 33   Date for PT Re-Evaluation 02/29/16   Authorization Type G Code 1   Authorization Time Period 10   PT Start Time 1528   PT Stop Time 1600   PT Time Calculation (min) 32 min   Equipment Utilized During Treatment Gait belt   Activity Tolerance Patient tolerated treatment well   Behavior During Therapy WFL for tasks assessed/performed      Past Medical History:  Diagnosis Date  . Atrial fibrillation ([facility])    controlled  . Hypertension    controlled by meds  . Parkinson disease ([facility])     History reviewed. No pertinent surgical history.  There were no vitals filed for this visit.      Subjective Assessment - 02/08/16 1530    Subjective Patient and wife denies falls. He states he is not interested in the PWR program but may be interested in supervised exercise. He states he wants to continue seeing therapy.    Patient is accompained by: Family member   Pertinent History Factors affecting Rehab: age, progressive disease, family support, limited activity level   Limitations Walking   How long can you sit comfortably? N/A   How long can you stand comfortably? patient states he is uncertain how to answer   How long can you walk comfortably? 120 feet   Patient Stated Goals improve balance because he has difficulty turning quickly    Currently in Pain? No/denies     Treatment:  Standing PWR rock progression, instructed patient to reach for alternating card while performing weight shifts towards ipsilateral UE, 3x10 taps, minA for stability, min VCs to increase posture  throughout and exercise technique; requires seated rest break x1 to improve posture Standing forward tapping on dots, 2x10, minA for stability, min VCs to maintain upright posture throughout Sit to stands with ball catch at top of stance, 2x10, minA for safety, min VCs to decrease the use of momentum and use BLE to improve strengthening Backwards walking, x4 ~15 feet, no AD, min-modA for stability, mod VCs to perform wider post leg movement to decrease scissoring Instructed to practice turns with rollator; instructed to move from 2 chairs back and forth with repetitive turning, x6, CGA for safety, min VCs to step more often especially with LLE in order to decrease crossing feet and increased tripping hazard, improved with more repetitions Seated hip ABD, red tband resistance, to allow for muscle strengthening and appropriate posture throughout, 2x10, min VCs for initial set up and eccentric control                            PT Education - 02/08/16 1831    Education provided Yes   Education Details POC, correct use of rollator   Person(s) Educated Patient   Methods Explanation   Comprehension Verbalized understanding             PT Long Term Goals - 02/01/16 1731      PT LONG TERM GOAL #1   Title Patient will be independent in HEP  in order to increase patient's ability to maintain gains achieved in therapy and assist with return to PLOF.    Time 4   Period Weeks   Status On-going     PT LONG TERM GOAL #2   Title Patient will increase overall strength to 4/5 in BLE to increase ROM/strength throughout functional activities performed in his ADLs.    Time 4   Period Weeks   Status Partially Met     PT LONG TERM GOAL #3   Title Patient will be able to independently ascend/descend 5 steps with R unilateral hand rail in order to allow for safe entrance/exit from his home.    Time 4   Period Weeks   Status On-going     PT LONG TERM GOAL #4   Title Patient  will decrease the time it takes to perform 5 times sit to stand to <14 seconds without UE use in order to decrease his risk for falls.     Time 4   Period Weeks   Status Partially Met     PT LONG TERM GOAL #5   Title Patient will increase his 10 m walk test time to >1.0 m/s in order to be a community ambulator at decreased risk for falls.    Time 4   Period Weeks   Status Partially Met     PT LONG TERM GOAL #6   Title Patient's Berg balance score will improve by 7 points in order to decrease patient's risk for falls and increase safety in his home.   Time 4   Period Weeks   Status Partially Met               Plan - 02/08/16 1831    Clinical Impression Statement Pt presents to PT on time but had to use the bathroom and was not seen until 15 minutes after patient's treatment was started. The patient requires minA during activities to prevent falls and off balance. Pt continues to demonstrate poor turning ability but does improve with repeated practice. The patient would continue to benefit from skilled PT until he can be transitioned into another program that can help him decrease his risk for falls and maintain the gains he has made in therapy.    Rehab Potential Good   Clinical Impairments Affecting Rehab Potential Postive Factors: motivated, family support; Negative Factors: degenerative disease, age, chronic issues Clinical Presenstation: Evolving-falls risk, progressive disease   PT Frequency 1x / week   PT Duration 4 weeks   PT Treatment/Interventions ADLs/Self Care Home Management;Aquatic Therapy;Biofeedback;Cryotherapy;Electrical Stimulation;Moist Heat;Traction;DME Instruction;Gait training;Stair training;Functional mobility training;Therapeutic activities;Therapeutic exercise;Balance training;Neuromuscular re-education;Patient/family education;Manual techniques;Passive range of motion;Energy conservation   PT Next Visit Plan initate qudruped and floor to stand transfer    PT  Home Exercise Plan HEP maintained; patient now has seated, supine, prone PWR exercises, and standing PWR exercises   Consulted and Agree with Plan of Care Patient;Family member/caregiver   Family Member Consulted wife      Patient will benefit from skilled therapeutic intervention in order to improve the following deficits and impairments:  Decreased activity tolerance, Decreased balance, Decreased coordination, Decreased endurance, Decreased mobility, Decreased range of motion, Decreased safety awareness, Decreased strength, Difficulty walking, Hypomobility, Impaired flexibility, Impaired sensation, Improper body mechanics, Postural dysfunction, Pain  Visit Diagnosis: Unsteadiness on feet  Muscle weakness (generalized)  Abnormal posture     Problem List There are no active problems to display for this patient.  Tilman Neat, SPT This entire session  was performed under direct supervision and direction of a licensed Chiropractor . I have personally read, edited and approve of the note as written.   Trotter,Margaret PT, DPT 02/09/2016, 10:49 AM  [place] MAIN [facility] SERVICES [address] [place], [place], [address] Phone: [phone]   Fax:  [phone]  Name: Gavin Beltran. MRN: [id number] Date of Birth: Sep 23, 1923

## 2016-02-16 ENCOUNTER — Ambulatory Visit: Payer: Medicare Other | Admitting: Physical Therapy

## 2016-02-16 ENCOUNTER — Encounter: Payer: Self-pay | Admitting: Physical Therapy

## 2016-02-16 DIAGNOSIS — R293 Abnormal posture: Secondary | ICD-10-CM

## 2016-02-16 DIAGNOSIS — R2681 Unsteadiness on feet: Secondary | ICD-10-CM | POA: Diagnosis not present

## 2016-02-16 DIAGNOSIS — M6281 Muscle weakness (generalized): Secondary | ICD-10-CM

## 2016-02-16 NOTE — Therapy (Signed)
[place] MAIN [facility] SERVICES [address] [place], [place], [address] Phone: [phone]   Fax:  [phone]  Physical Therapy Treatment/Discharge  Patient Details  Name: Gavin Beltran. MRN: [id number] Date of Birth: 06/09/1923 Referring Provider: Dr. Manuella Ghazi  Encounter Date: 02/16/2016      PT End of Session - 02/16/16 1629    Visit Number 22   Number of Visits 33   Date for PT Re-Evaluation 02/29/16   Authorization Type G Code 2   Authorization Time Period 10   PT Start Time 1530   PT Stop Time 1614   PT Time Calculation (min) 44 min   Equipment Utilized During Treatment Gait belt   Activity Tolerance Patient tolerated treatment well   Behavior During Therapy WFL for tasks assessed/performed      Past Medical History:  Diagnosis Date  . Atrial fibrillation ([place])    controlled  . Hypertension    controlled by meds  . Parkinson disease ([place])     History reviewed. No pertinent surgical history.  There were no vitals filed for this visit.      Subjective Assessment - 02/16/16 1534    Subjective Patient denies falls. He states that he has walked and done [employer] but not PWR exercises.    Patient is accompained by: Family member   Pertinent History Factors affecting Rehab: age, progressive disease, family support, limited activity level   Limitations Walking   How long can you sit comfortably? N/A   How long can you stand comfortably? patient states he is uncertain how to answer   How long can you walk comfortably? 120 feet   Patient Stated Goals improve balance because he has difficulty turning quickly    Currently in Pain? No/denies            [facility] PT Assessment - 02/16/16 0001      Strength   Right Hip Flexion 4-/5   Right Hip ABduction 5/5   Right Hip ADduction 5/5   Left Hip Flexion 4-/5   Left Hip ABduction 5/5   Left Hip ADduction 5/5   Right Knee Flexion 4/5   Right Knee Extension 4+/5   Left  Knee Flexion 4/5   Left Knee Extension 4+/5   Right Ankle Dorsiflexion 4+/5   Left Ankle Dorsiflexion 4+/5     Standardized Balance Assessment   Five times sit to stand comments  22.1s with UE use, 13.8s   10 Meter Walk 0.36ms     Berg Balance Test   Sit to Stand Able to stand  independently using hands   Standing Unsupported Able to stand safely 2 minutes   Sitting with Back Unsupported but Feet Supported on Floor or Stool Able to sit safely and securely 2 minutes   Stand to Sit Controls descent by using hands   Transfers Able to transfer safely, definite need of hands   Standing Unsupported with Eyes Closed Able to stand 10 seconds safely   Standing Ubsupported with Feet Together Able to place feet together independently and stand 1 minute safely   From Standing, Reach Forward with Outstretched Arm Can reach forward >12 cm safely (5")   From Standing Position, Pick up Object from Floor Able to pick up shoe, needs supervision   From Standing Position, Turn to Look Behind Over each Shoulder Looks behind one side only/other side shows less weight shift   Turn 360 Degrees Able to turn 360 degrees safely but slowly  Standing Unsupported, Alternately Place Feet on Step/Stool Able to stand independently and safely and complete 8 steps in 20 seconds   Standing Unsupported, One Foot in Front Able to plae foot ahead of the other independently and hold 30 seconds   Standing on One Leg Able to lift leg independently and hold equal to or more than 3 seconds   Total Score 45     Timed Up and Go Test   Normal TUG (seconds) 20                             PT Education - 02/16/16 1629    Education provided Yes   Education Details transitioning POC, correct use of rollator   Person(s) Educated Patient   Methods Explanation   Comprehension Verbalized understanding             PT Long Term Goals - 02/16/16 1631      PT LONG TERM GOAL #1   Title Patient will be  independent in HEP in order to increase patient's ability to maintain gains achieved in therapy and assist with return to PLOF.    Baseline Patient not compliant with exercises   Time 4   Period Weeks   Status Not Met     PT LONG TERM GOAL #2   Title Patient will increase overall strength to 4/5 in BLE to increase ROM/strength throughout functional activities performed in his ADLs.    Baseline Achieved other than hip flexion at 4-/5   Time 4   Period Weeks   Status Partially Met     PT LONG TERM GOAL #3   Title Patient will be able to independently ascend/descend 5 steps with R unilateral hand rail in order to allow for safe entrance/exit from his home.    Time 4   Period Weeks   Status Unable to assess  Did not assess, wife reports patient has mild difficulty still     PT LONG TERM GOAL #4   Title Patient will decrease the time it takes to perform 5 times sit to stand to <14 seconds without UE use in order to decrease his risk for falls.     Time 4   Period Weeks   Status Partially Met     PT LONG TERM GOAL #5   Title Patient will increase his 10 m walk test time to >1.0 m/s in order to be a community ambulator at decreased risk for falls.    Time 4   Period Weeks   Status Partially Met     PT LONG TERM GOAL #6   Title Patient's Berg balance score will improve by 7 points in order to decrease patient's risk for falls and increase safety in his home.   Time 4   Period Weeks   Status Partially Met               Plan - 02/16/16 1634    Clinical Impression Statement The patient was seen for 44 minutes but the last 10 minutes were unbilled due to information about continuation of plan of care and discharge options. Patient's goals were re-assessed to identify changes in activity. The patient had little to no changes with a mild improvement in 5x sit to stand. The patient continues to have balance deficitis and mobility deficits; however the patient was non-compliant with  the exercises at home. Due to little changes in improvement the patient was discharge. He  was given information for a Parkinson Wellness Recovery Exercise Class and a supervised fitness class based on his preference.   Rehab Potential Good   Clinical Impairments Affecting Rehab Potential Postive Factors: motivated, family support; Negative Factors: degenerative disease, age, chronic issues Clinical Presenstation: Evolving-falls risk, progressive disease   PT Frequency 1x / week   PT Duration 4 weeks   PT Treatment/Interventions ADLs/Self Care Home Management;Aquatic Therapy;Biofeedback;Cryotherapy;Electrical Stimulation;Moist Heat;Traction;DME Instruction;Gait training;Stair training;Functional mobility training;Therapeutic activities;Therapeutic exercise;Balance training;Neuromuscular re-education;Patient/family education;Manual techniques;Passive range of motion;Energy conservation   PT Next Visit Plan initate qudruped and floor to stand transfer    PT Home Exercise Plan HEP maintained; patient now has seated, supine, prone PWR exercises, and standing PWR exercises   Consulted and Agree with Plan of Care Patient;Family member/caregiver   Family Member Consulted wife      Patient will benefit from skilled therapeutic intervention in order to improve the following deficits and impairments:  Decreased activity tolerance, Decreased balance, Decreased coordination, Decreased endurance, Decreased mobility, Decreased range of motion, Decreased safety awareness, Decreased strength, Difficulty walking, Hypomobility, Impaired flexibility, Impaired sensation, Improper body mechanics, Postural dysfunction, Pain  Visit Diagnosis: Unsteadiness on feet  Muscle weakness (generalized)  Abnormal posture     Problem List There are no active problems to display for this patient.  Tilman Neat, SPT  This entire session was performed under direct supervision and direction of a licensed therapist/therapist  assistant . I have personally read, edited and approve of the note as written.  Collie Siad PT, DPT 02/16/2016, 6:24 PM  [place] MAIN [facility] SERVICES [address] [place], [place], [address] Phone: [phone]   Fax:  [phone]  Name: Gavin Beltran. MRN: [id number] Date of Birth: 1923-08-20

## 2016-04-07 ENCOUNTER — Emergency Department: Payer: Medicare Other

## 2016-04-07 ENCOUNTER — Emergency Department
Admission: EM | Admit: 2016-04-07 | Discharge: 2016-04-07 | Disposition: A | Discharge status: Home / Self Care | Payer: Medicare Other | Attending: Emergency Medicine | Admitting: Emergency Medicine

## 2016-04-07 ENCOUNTER — Encounter: Payer: Self-pay | Admitting: Intensive Care

## 2016-04-07 DIAGNOSIS — Y92009 Unspecified place in unspecified non-institutional (private) residence as the place of occurrence of the external cause: Secondary | ICD-10-CM | POA: Diagnosis not present

## 2016-04-07 DIAGNOSIS — I10 Essential (primary) hypertension: Secondary | ICD-10-CM | POA: Diagnosis not present

## 2016-04-07 DIAGNOSIS — Y939 Activity, unspecified: Secondary | ICD-10-CM | POA: Insufficient documentation

## 2016-04-07 DIAGNOSIS — Z79899 Other long term (current) drug therapy: Secondary | ICD-10-CM | POA: Insufficient documentation

## 2016-04-07 DIAGNOSIS — S0101XA Laceration without foreign body of scalp, initial encounter: Secondary | ICD-10-CM | POA: Diagnosis not present

## 2016-04-07 DIAGNOSIS — Z7982 Long term (current) use of aspirin: Secondary | ICD-10-CM | POA: Diagnosis not present

## 2016-04-07 DIAGNOSIS — W1809XA Striking against other object with subsequent fall, initial encounter: Secondary | ICD-10-CM | POA: Insufficient documentation

## 2016-04-07 DIAGNOSIS — G20 Parkinson's disease: Secondary | ICD-10-CM | POA: Diagnosis not present

## 2016-04-07 DIAGNOSIS — S0990XA Unspecified injury of head, initial encounter: Secondary | ICD-10-CM | POA: Diagnosis present

## 2016-04-07 DIAGNOSIS — Y999 Unspecified external cause status: Secondary | ICD-10-CM | POA: Diagnosis not present

## 2016-04-07 HISTORY — DX: Obstructive sleep apnea (adult) (pediatric): G47.33

## 2016-04-07 MED ORDER — LIDOCAINE HCL (PF) 1 % IJ SOLN
INTRAMUSCULAR | Status: AC
  Filled 2016-04-07: qty 10

## 2016-04-07 NOTE — Discharge Instructions (Signed)
You have been seen in the Emergency Department (ED) today for a laceration (cut).  Please keep the cut clean but do not submerge it in the water.  It has been repaired with staples or sutures that will need to be removed in about 10 days. Please follow up with your doctor, an urgent care, or return to the ED for suture removal.    Please take Tylenol (acetaminophen) as needed for discomfort as written on the box.   Please follow up with your doctor as soon as possible regarding today's emergent visit.   Return to the ED or call your doctor if you notice any signs of infection such as fever, increased pain, increased redness, pus, or other symptoms that concern you.

## 2016-04-07 NOTE — ED Triage Notes (Addendum)
PAtient arrived by EMS from home. Patient suffered a mechanical fall. LAceration noted to R side of head. Patient c/o slight headache. Denies LOC. A&O x4. EMS vitals 133/75b/p, HR86, RR16 even and unlabored, O2 96%. HX of parkinsons

## 2016-04-07 NOTE — ED Provider Notes (Signed)
[facility] Emergency Department Provider Note   ____________________________________________   First MD Initiated Contact with Patient 04/07/16 1025     (approximate)  I have reviewed the triage vital signs and the nursing notes.   HISTORY  Chief Complaint Fall    HPI Gavin Beltran. is a 82 y.o. male history of A. fib, elevated blood pressure and Parkinson's.  Wifee in the house, and it with him today reports thatshe heard him fall. She came to his side, found him on the floor and he had fallen and struck the right side of his head on a cabinet. He was awake and alert. Patient reports he remembers falling, he went to back up and reports that due to his Parkinson's he loses balance easily and fell backwards with his head into a cabinet, striking the edge.  Denies significant pain, states tenderness over the right scalp. No nausea or vomiting. No numbness or weakness. No neck pain. No preceding symptoms such as chest pain shortness of breath or racing heart. Reports he falls frequently, and this was no different but struck the back of a cabinet today.  Takes aspirin only, the wife reports he used to take a strong blood thinner but was taken off due to falls.   Past Medical History:  Diagnosis Date  . Atrial fibrillation ([place])    controlled  . Hypertension    controlled by meds  . Obstructive sleep apnea   . Parkinson disease ([place])     There are no active problems to display for this patient.   Past Surgical History:  Procedure Laterality Date  . HEMORRHOID SURGERY    . HERNIA REPAIR      Prior to Admission medications   Medication Sig Start Date End Date Taking? Authorizing Provider  aspirin EC 81 MG tablet Take by mouth. 01/13/10  Yes Historical Provider, MD  glycopyrrolate (ROBINUL) 1 MG tablet Take 1 mg by mouth 2 (two) times daily.   Yes Historical Provider, MD  Multiple Vitamin (MULTI-VITAMINS) TABS Take 1 tablet by mouth daily.     Yes Historical Provider, MD  potassium chloride SA (K-DUR,KLOR-CON) 20 MEQ tablet Take 20 mEq by mouth daily.   Yes Historical Provider, MD  PROAIR HFA 108 (90 BASE) MCG/ACT inhaler  10/26/14  Yes Historical Provider, MD  ramipril (ALTACE) 5 MG capsule Take 5 mg by mouth daily.   Yes Historical Provider, MD  rOPINIRole (REQUIP) 1 MG tablet Take 2 mg by mouth 3 (three) times daily.   Yes Historical Provider, MD  TOPROL XL 25 MG 24 hr tablet Take 1 tablet by mouth daily. 03/03/16  Yes Historical Provider, MD  pantoprazole (PROTONIX) 20 MG tablet Take 1 tablet (20 mg total) by mouth daily. Patient not taking: Reported on 04/07/2016 05/29/15 05/28/16  Daymon Larsen, MD    Allergies Bromide ion [bromine]; Gabapentin; Quinine derivatives; and Tramadol  History reviewed. No pertinent family history.  Social History Social History  Substance Use Topics  . Smoking status: Never Smoker  . Smokeless tobacco: Never Used  . Alcohol use No    Review of Systems Constitutional: No fever/chills. No recent illnesses or changes in his normal health Eyes: No visual changes. ENT: No sore throat. Cardiovascular: Denies chest pain. Respiratory: Denies shortness of breath. Gastrointestinal: No abdominal pain.  No nausea, no vomiting.   Genitourinary: Negative for dysuria. Musculoskeletal: Negative for back pain. Skin: Negative for rash. Neurological: Negative for headaches, focal weakness or numbness.  10-point  ROS otherwise negative.  ____________________________________________   PHYSICAL EXAM:  VITAL SIGNS: ED Triage Vitals  Enc Vitals Group     BP 04/07/16 0943 115/73     Pulse Rate 04/07/16 0943 82     Resp 04/07/16 0943 15     Temp 04/07/16 0943 97.8 F (36.6 C)     Temp Source 04/07/16 0943 Oral     SpO2 04/07/16 0943 97 %     Weight 04/07/16 0948 160 lb (72.6 kg)     Height 04/07/16 0948 5\' 8"  (1.727 m)     Head Circumference --      Peak Flow --      Pain Score 04/07/16 0948 1       Pain Loc --      Pain Edu? --      Excl. in [place]? --     Constitutional: Alert and oriented. Well appearing and in no acute distress.. Pleasant with a soft voice and Parkinsonian appearance. Doristine Bosworth, wife at bedside. Eyes: Conjunctivae are normal. PERRL. EOMI. Head: Atraumatic except for a approximately 3 cm linear scalp laceration at the vertex. Bleeding controlled. Nose: No congestion/rhinnorhea. Mouth/Throat: Mucous membranes are moist.  Oropharynx non-erythematous. Neck: No stridor.  No midline cervical tenderness. Full range of motion of the neck without pain. Cardiovascular: Normal rate, irregular rhythm. Grossly normal heart sounds.  Good peripheral circulation. Respiratory: Normal respiratory effort.  No retractions. Lungs CTAB. Gastrointestinal: Soft and nontender. No distention. No abdominal bruits. No CVA tenderness. Musculoskeletal: No lower extremity tenderness nor edema.  No joint effusions. Neurologic:  Normal speech and language. No gross focal neurologic deficits are appreciated. Skin:  Skin is warm, dry and intact. No rash noted. Psychiatric: Mood and affect are normal. Speech and behavior are normal.  ____________________________________________   LABS (all labs ordered are listed, but only abnormal results are displayed)  Labs Reviewed - No data to display ____________________________________________  EKG   ____________________________________________  RADIOLOGY  Ct Head Wo Contrast  Result Date: 04/07/2016 CLINICAL DATA:  Pain after fall. EXAM: CT HEAD WITHOUT CONTRAST CT CERVICAL SPINE WITHOUT CONTRAST TECHNIQUE: Multidetector CT imaging of the head and cervical spine was performed following the standard protocol without intravenous contrast. Multiplanar CT image reconstructions of the cervical spine were also generated. COMPARISON:  None. FINDINGS: CT HEAD FINDINGS Brain: No subdural, epidural, or subarachnoid hemorrhage. Cerebellum brainstem are normal.  Basal cisterns are patent. Ventricles are prominent but stable. Scattered white matter changes are relatively mild. No acute cortical ischemia or infarct. No mass, mass effect, or midline shift. Vascular: Calcified atherosclerotic changes are seen in the intracranial carotid arteries. Skull: Normal. Negative for fracture or focal lesion. Sinuses/Orbits: No acute finding. Other: None. CT CERVICAL SPINE FINDINGS Alignment: Normal. Skull base and vertebrae: No acute fracture. No primary bone lesion or focal pathologic process. Soft tissues and spinal canal: No prevertebral fluid or swelling. No visible canal hematoma. Disc levels:  Multilevel degenerative changes. Upper chest: Negative. Other: No other abnormalities. IMPRESSION: 1. No acute intracranial abnormality. 2. No fracture or traumatic malalignment in the cervical spine. Electronically Signed   By: Dorise Bullion III M.D   On: 04/07/2016 12:37   Ct Cervical Spine Wo Contrast  Result Date: 04/07/2016 CLINICAL DATA:  Pain after fall. EXAM: CT HEAD WITHOUT CONTRAST CT CERVICAL SPINE WITHOUT CONTRAST TECHNIQUE: Multidetector CT imaging of the head and cervical spine was performed following the standard protocol without intravenous contrast. Multiplanar CT image reconstructions of the cervical spine were also generated. COMPARISON:  None. FINDINGS: CT HEAD FINDINGS Brain: No subdural, epidural, or subarachnoid hemorrhage. Cerebellum brainstem are normal. Basal cisterns are patent. Ventricles are prominent but stable. Scattered white matter changes are relatively mild. No acute cortical ischemia or infarct. No mass, mass effect, or midline shift. Vascular: Calcified atherosclerotic changes are seen in the intracranial carotid arteries. Skull: Normal. Negative for fracture or focal lesion. Sinuses/Orbits: No acute finding. Other: None. CT CERVICAL SPINE FINDINGS Alignment: Normal. Skull base and vertebrae: No acute fracture. No primary bone lesion or focal  pathologic process. Soft tissues and spinal canal: No prevertebral fluid or swelling. No visible canal hematoma. Disc levels:  Multilevel degenerative changes. Upper chest: Negative. Other: No other abnormalities. IMPRESSION: 1. No acute intracranial abnormality. 2. No fracture or traumatic malalignment in the cervical spine. Electronically Signed   By: Dorise Bullion III M.D   On: 04/07/2016 12:37    ____________________________________________   PROCEDURES  Procedure(s) performed: laceration  LACERATION REPAIR Performed by: Delman Kitten Authorized by: Delman Kitten Consent: Verbal consent obtained. Risks and benefits: risks, benefits and alternatives were discussed Consent given by: patient Patient identity confirmed: provided demographic data Prepped and Draped in normal sterile fashion Wound explored  Laceration Location: scalp, midline vertex  Laceration Length: 3 cm  No Foreign Bodies seen or palpated  Anesthesia: local infiltration  Local anesthetic: lidocaine 1% no epinephrine  Anesthetic total: 3 ml  Irrigation method: syringe Amount of cleaning: standard  Skin closure: staples  Number of sutures: 5  Technique: interrupted staples  Patient tolerance: Patient tolerated the procedure well with no immediate complications.   Procedures  Critical Care performed: No  ____________________________________________   INITIAL IMPRESSION / ASSESSMENT AND PLAN / ED COURSE  Pertinent labs & imaging results that were available during my care of the patient were reviewed by me and considered in my medical decision making (see chart for details).  Patient reports mechanical fall. Fall strictly due to Parkinson's. Today suffered a head injury, no loss of consciousness. In CT head and cervical spine to evaluate for traumatic injury. Discussed obtaining blood work with patient and his wife, they reported as normal health and falls frequently and after shared medical  decision making we will forego any lab work is appears highly likely this was a typical mechanical fall.  Clinical Course     ----------------------------------------- 1:22 PM on 04/07/2016 -----------------------------------------  Laceration repair. Patient awake and alert. Patient, wife verbalize understanding of plan and close follow directions. Discussed with the patient obtaining a tetanus, they believe he is up-to-date and request not to receive today after discussion and medical decision making. I did recommend it however.  Patient will follow-up closely with his primary and staples removal in about 10-12 days ____________________________________________   FINAL CLINICAL IMPRESSION(S) / ED DIAGNOSES  Final diagnoses:  Laceration of scalp, initial encounter      NEW MEDICATIONS STARTED DURING THIS VISIT:  New Prescriptions   No medications on file     Note:  This document was prepared using Dragon voice recognition software and may include unintentional dictation errors.     Delman Kitten, MD 04/07/16 1323

## 2016-05-09 ENCOUNTER — Other Ambulatory Visit: Payer: Self-pay | Admitting: Student

## 2016-05-09 DIAGNOSIS — R1310 Dysphagia, unspecified: Secondary | ICD-10-CM

## 2016-05-11 ENCOUNTER — Ambulatory Visit
Admission: RE | Admit: 2016-05-11 | Discharge: 2016-05-11 | Disposition: A | Discharge status: Home / Self Care | Payer: Medicare Other | Source: Ambulatory Visit | Attending: Student | Admitting: Student

## 2016-05-11 DIAGNOSIS — K222 Esophageal obstruction: Secondary | ICD-10-CM | POA: Insufficient documentation

## 2016-05-11 DIAGNOSIS — K449 Diaphragmatic hernia without obstruction or gangrene: Secondary | ICD-10-CM | POA: Diagnosis not present

## 2016-05-11 DIAGNOSIS — G20 Parkinson's disease: Secondary | ICD-10-CM | POA: Insufficient documentation

## 2016-05-11 DIAGNOSIS — R1310 Dysphagia, unspecified: Secondary | ICD-10-CM | POA: Insufficient documentation

## 2016-05-15 ENCOUNTER — Ambulatory Visit: Payer: Medicare Other | Admitting: Podiatry

## 2016-05-22 ENCOUNTER — Encounter: Payer: Self-pay | Admitting: Podiatry

## 2016-05-22 ENCOUNTER — Ambulatory Visit (INDEPENDENT_AMBULATORY_CARE_PROVIDER_SITE_OTHER): Payer: Medicare Other | Admitting: Podiatry

## 2016-05-22 DIAGNOSIS — M79676 Pain in unspecified toe(s): Secondary | ICD-10-CM | POA: Diagnosis not present

## 2016-05-22 DIAGNOSIS — G20 Parkinson's disease: Secondary | ICD-10-CM

## 2016-05-22 DIAGNOSIS — B351 Tinea unguium: Secondary | ICD-10-CM | POA: Diagnosis not present

## 2016-05-22 NOTE — Progress Notes (Signed)
Complaint:  Visit Type: Patient returns to my office for continued preventative foot care services. Complaint: Patient states" my nails have grown long and thick and become painful to walk and wear shoes" . The patient presents for preventative foot care services. No changes to ROS  Podiatric Exam: Vascular: dorsalis pedis and posterior tibial pulses are palpable bilateral. Capillary return is immediate. Temperature gradient is WNL. Skin turgor WNL  Sensorium: Normal Semmes Weinstein monofilament test. Normal tactile sensation bilaterally. Nail Exam: Pt has thick disfigured discolored nails with subungual debris noted bilateral entire nail hallux through fifth toenails Ulcer Exam: There is no evidence of ulcer or pre-ulcerative changes or infection. Orthopedic Exam: Muscle tone and strength are WNL. No limitations in general ROM. No crepitus or effusions noted. Foot type and digits show no abnormalities. Bony prominences are unremarkable. Skin: No Porokeratosis. No infection or ulcers  Diagnosis:  Onychomycosis, , Pain in right toe, pain in left toes  Treatment & Plan Procedures and Treatment: Consent by patient was obtained for treatment procedures. The patient understood the discussion of treatment and procedures well. All questions were answered thoroughly reviewed. Debridement of mycotic and hypertrophic toenails, 1 through 5 bilateral and clearing of subungual debris. No ulceration, no infection noted.  Return Visit-Office Procedure: Patient instructed to return to the office for a follow up visit 3 months   for continued evaluation and treatment.    Delania Ferg DPM 

## 2016-06-08 ENCOUNTER — Emergency Department: Payer: Medicare Other

## 2016-06-08 ENCOUNTER — Encounter: Payer: Self-pay | Admitting: Emergency Medicine

## 2016-06-08 ENCOUNTER — Emergency Department
Admission: EM | Admit: 2016-06-08 | Discharge: 2016-06-08 | Disposition: A | Discharge status: Home / Self Care | Payer: Medicare Other | Attending: Emergency Medicine | Admitting: Emergency Medicine

## 2016-06-08 DIAGNOSIS — G20 Parkinson's disease: Secondary | ICD-10-CM | POA: Insufficient documentation

## 2016-06-08 DIAGNOSIS — W108XXA Fall (on) (from) other stairs and steps, initial encounter: Secondary | ICD-10-CM | POA: Insufficient documentation

## 2016-06-08 DIAGNOSIS — Y92009 Unspecified place in unspecified non-institutional (private) residence as the place of occurrence of the external cause: Secondary | ICD-10-CM | POA: Insufficient documentation

## 2016-06-08 DIAGNOSIS — S0181XA Laceration without foreign body of other part of head, initial encounter: Secondary | ICD-10-CM | POA: Diagnosis not present

## 2016-06-08 DIAGNOSIS — S0101XA Laceration without foreign body of scalp, initial encounter: Secondary | ICD-10-CM | POA: Diagnosis not present

## 2016-06-08 DIAGNOSIS — I10 Essential (primary) hypertension: Secondary | ICD-10-CM | POA: Insufficient documentation

## 2016-06-08 DIAGNOSIS — Z7982 Long term (current) use of aspirin: Secondary | ICD-10-CM | POA: Diagnosis not present

## 2016-06-08 DIAGNOSIS — Y999 Unspecified external cause status: Secondary | ICD-10-CM | POA: Diagnosis not present

## 2016-06-08 DIAGNOSIS — Z79899 Other long term (current) drug therapy: Secondary | ICD-10-CM | POA: Diagnosis not present

## 2016-06-08 DIAGNOSIS — S62522A Displaced fracture of distal phalanx of left thumb, initial encounter for closed fracture: Secondary | ICD-10-CM | POA: Diagnosis not present

## 2016-06-08 DIAGNOSIS — W19XXXA Unspecified fall, initial encounter: Secondary | ICD-10-CM

## 2016-06-08 DIAGNOSIS — R791 Abnormal coagulation profile: Secondary | ICD-10-CM | POA: Diagnosis not present

## 2016-06-08 DIAGNOSIS — S6992XA Unspecified injury of left wrist, hand and finger(s), initial encounter: Secondary | ICD-10-CM | POA: Diagnosis present

## 2016-06-08 DIAGNOSIS — S80211A Abrasion, right knee, initial encounter: Secondary | ICD-10-CM | POA: Insufficient documentation

## 2016-06-08 DIAGNOSIS — Y939 Activity, unspecified: Secondary | ICD-10-CM | POA: Diagnosis not present

## 2016-06-08 DIAGNOSIS — T07XXXA Unspecified multiple injuries, initial encounter: Secondary | ICD-10-CM

## 2016-06-08 LAB — PROTIME-INR
INR: 1.07
Prothrombin Time: 13.9 seconds (ref 11.4–15.2)

## 2016-06-08 LAB — CBC
HEMATOCRIT: 37.8 % — AB (ref 40.0–52.0)
HEMOGLOBIN: 13 g/dL (ref 13.0–18.0)
MCH: 31.1 pg (ref 26.0–34.0)
MCHC: 34.4 g/dL (ref 32.0–36.0)
MCV: 90.4 fL (ref 80.0–100.0)
Platelets: 217 10*3/uL (ref 150–440)
RBC: 4.18 MIL/uL — AB (ref 4.40–5.90)
RDW: 14.6 % — AB (ref 11.5–14.5)
WBC: 6 10*3/uL (ref 3.8–10.6)

## 2016-06-08 LAB — BASIC METABOLIC PANEL
ANION GAP: 5 (ref 5–15)
BUN: 17 mg/dL (ref 6–20)
CALCIUM: 8.8 mg/dL — AB (ref 8.9–10.3)
CO2: 28 mmol/L (ref 22–32)
Chloride: 101 mmol/L (ref 101–111)
Creatinine, Ser: 1.11 mg/dL (ref 0.61–1.24)
GFR calc Af Amer: >60 mL/min (ref >60)
GFR calc non Af Amer: 56 mL/min — ABNORMAL LOW (ref >60)
GLUCOSE: 105 mg/dL — AB (ref 65–99)
POTASSIUM: 4.5 mmol/L (ref 3.5–5.1)
Sodium: 134 mmol/L — ABNORMAL LOW (ref 135–145)

## 2016-06-08 MED ORDER — LIDOCAINE-EPINEPHRINE-TETRACAINE (LET) SOLUTION
3.0000 mL | Freq: Once | NASAL | Status: AC
  Administered 2016-06-08: 17:00:00 3 mL via TOPICAL
  Filled 2016-06-08: qty 3

## 2016-06-08 MED ORDER — ACETAMINOPHEN 500 MG PO TABS
1000.0000 mg | ORAL_TABLET | Freq: Once | ORAL | Status: AC
  Administered 2016-06-08: 1000 mg via ORAL
  Filled 2016-06-08: qty 2

## 2016-06-08 NOTE — ED Triage Notes (Addendum)
Pt to ED via EMS from home with c/o mechanical fall. Per EMS pt loss balance on brick steps at home, denies any LOC. Pt has hx of Parkinsons. Pt takes daily Asprin and states on Warfarin . Pt A&Ox4, lac noted to forehead and around hematoma to left side of head. Bleeding controlled. Vs stable

## 2016-06-08 NOTE — Discharge Instructions (Signed)
These make an appointment with your primary care physician to have your staples removed in 5 days, and all of your wounds checked. Please make an appointment with the orthopedic doctor to have your thumb fracture reevaluated in 5-7 days.  Please make sure that the skin glue on your forehead does not get wet; it will fall off on its own.  Please take all precautions to prevent falls.  Return to the emergency department if you develop severe pain, numbness tingling or weakness, lightheadedness or fainting, changes in mental status, vomiting, signs of infection around the wounds, or any other symptoms concerning to you.

## 2016-06-08 NOTE — ED Notes (Signed)
Pt face and hands cleaned at this time

## 2016-06-08 NOTE — ED Provider Notes (Signed)
[facility] Emergency Department Provider Note  ____________________________________________  Time seen: Approximately 4:50 PM  I have reviewed the triage vital signs and the nursing notes.   HISTORY  Chief Complaint Fall    HPI Gavin Beltran. is a 83 y.o. male with a history of Parkinson's disease, A. fib on Coumadin presenting after a fall with head injury, bilateral hand injury, and right knee abrasion. The patient reports that he turned while trying to unlock a door and fell down 5 brick steps in his garage. He did not lose consciousness. He has pain over his scalp but no severe headache or nausea or vomiting. No visual changes. No associated chest pain, shortness of breath, palpitations or syncope. The patient also has contusions to the bilateral hands and right knee. Last tetanus booster was less than 5 years ago.   Past Medical History:  Diagnosis Date  . Atrial fibrillation ([place])    controlled  . Hypertension    controlled by meds  . Obstructive sleep apnea   . Parkinson disease ([place])     There are no active problems to display for this patient.   Past Surgical History:  Procedure Laterality Date  . HEMORRHOID SURGERY    . HERNIA REPAIR      Current Outpatient Rx  . Order #: [id number] Class: Historical Med  . Order #: [id number] Class: Historical Med  . Order #: [id number] Class: Historical Med  . Order #: [id number] Class: Historical Med  . Order #: [id number] Class: Print  . Order #: [id number] Class: Historical Med  . Order #: [id number] Class: Historical Med  . Order #: [id number] Class: Historical Med  . Order #: [id number] Class: Historical Med  . Order #: [id number] Class: Historical Med  . Order #: [id number] Class: Historical Med    Allergies Bromide ion [bromine]; Gabapentin; Quinine; Quinine derivatives; Sulfa antibiotics; and Tramadol  No family history on file.  Social History Social History  Substance Use Topics  . Smoking status: Never  Smoker  . Smokeless tobacco: Never Used  . Alcohol use No    Review of Systems Constitutional: No fever/chills.Positive fall. Negative syncope. Negative loss of consciousness. Eyes: No visual changes. No blurred or double vision. ENT:  No congestion or rhinorrhea. Denies dental injury or jaw pain. Cardiovascular: Denies chest pain. Denies palpitations. Respiratory: Denies shortness of breath.  No cough. Gastrointestinal: No abdominal pain.  No nausea, no vomiting.  No diarrhea.  No constipation. Genitourinary: Negative for dysuria. Musculoskeletal: Negative for back pain. No neck pain. Ecchymosis without pain to the bilateral hands. Abrasion to the right knee. No pain in the hips. Skin: Negative for rash. Neurological: Negative for headaches. No focal numbness, tingling or weakness. No visual changes, speech changes or mental status changes.  10-point ROS otherwise negative.  ____________________________________________   PHYSICAL EXAM:  VITAL SIGNS: ED Triage Vitals  Enc Vitals Group     BP 06/08/16 1640 110/75     Pulse Rate 06/08/16 1640 64     Resp 06/08/16 1640 16     Temp 06/08/16 1640 97.4 F (36.3 C)     Temp Source 06/08/16 1640 Axillary     SpO2 06/08/16 1640 98 %     Weight --      Height --      Head Circumference --      Peak Flow --      Pain Score 06/08/16 1641 3     Pain Loc --      Pain Edu? --  Excl. in [place]? --     Constitutional: Patient is alert and oriented and able to answer questions appropriately GCS is 15. Eyes: Conjunctivae are normal.  EOMI. No scleral icterus. No raccoon eyes. Head; no Battle sign. The patient has a 2 cm superficial linear abrasion to the left forehead. The patient has a 1.5 cm laceration over the posterior scalp with associated ecchymosis and swelling. Nose: No congestion/rhinnorhea. No swelling over the nose. No septal hematoma. Mouth/Throat: Mucous membranes are moist. No trismus. No dental injury or  malocclusion. Neck: No stridor.  Supple.  No midline C-spine tenderness to palpation, step-offs or deformities and full range of motion without pain. Cardiovascular: Normal rate, regular rhythm. No murmurs, rubs or gallops.  Respiratory: Normal respiratory effort.  No accessory muscle use or retractions. Lungs CTAB.  No wheezes, rales or ronchi. Gastrointestinal: Soft, nontender and nondistended.  No guarding or rebound.  No peritoneal signs. Musculoskeletal: Pelvis is stable. Full range of motion without pain of the bilateral hips, knees, ankles, wrists, elbows and shoulders. The patient has ecchymosis over the web between the thumb and the pointer finger on the bilateral hands with associated swelling, left greater than right. Normal radial pulses bilaterally. Skin is intact bilaterally. Patient has several old linear abrasions that are skin The Right Lateral Shin. He Also Has a 1 x 1 Cm Abrasion Just below the Patella That Appears New Neurologic:  A&Ox3.  Speech is occasionally mildly dysarthric, consistent with Parkinson's disease..  Face and smile are symmetric.  EOMI.  Moves all extremities well. Skin:  Skin is warm, dry. Psychiatric: Mood and affect are normal. Speech and behavior are normal.  Normal judgement.  ____________________________________________   LABS (all labs ordered are listed, but only abnormal results are displayed)  Labs Reviewed - No data to display ____________________________________________  EKG  ED ECG REPORT I, Eula Listen, the attending physician, personally viewed and interpreted this ECG.   Date: 06/08/2016  EKG Time: 1653  Rate: 66  Rhythm: afib  Axis: normal  Intervals:none  ST&T Change: No STEMI  ____________________________________________  RADIOLOGY  No results found.  ____________________________________________   PROCEDURES  Procedure(s) performed: None  Procedures  Critical Care performed:  No ____________________________________________   INITIAL IMPRESSION / ASSESSMENT AND PLAN / ED COURSE  Pertinent labs & imaging results that were available during my care of the patient were reviewed by me and considered in my medical decision making (see chart for details).  83 y.o. male with a history of Parkinson's disease presenting with a mechanical fall, resulting scalp laceration with contusion, forehead abrasion, bilateral hand ecchymosis, and right knee contusion and abrasion. The patient is anticoagulated with Coumadin, so get a CT scan of the head to rule out intracranial injury or bleeding. I'll get x-rays of the bilateral hands. The patient is up-to-date on his tetanus booster. We will clean the areas, use Dermabond to repair the laceration over his forehead and staples to repair the laceration over his scalp.  LACERATION REPAIR Performed by: Eula Listen Authorized by: Eula Listen Consent: Verbal consent obtained. Risks and benefits: risks, benefits and alternatives were discussed Consent given by: patient Patient identity confirmed: provided demographic data Prepped and Draped in normal sterile fashion Wound explored  Laceration Location: posterior scalp  Laceration Length: 2cm  No Foreign Bodies seen or palpated  Anesthesia: local infiltration  Local anesthetic: LET  Anesthetic total: 5 ml  Irrigation method: syringe Amount of cleaning: standard  Skin closure: Staples  Number of : 4 staples  Technique: simple  Patient tolerance: Patient tolerated the procedure well with no immediate complications. LACERATION REPAIR Performed by: Eula Listen Authorized by: Eula Listen Consent: Verbal consent obtained. Risks and benefits: risks, benefits and alternatives were discussed Consent given by: patient Patient identity confirmed: provided demographic data Prepped and Draped in normal sterile fashion Wound  explored  Laceration Location: left forehead  Laceration Length: 3cm  No Foreign Bodies seen or palpated  Anesthesia: local infiltration  Local anesthetic: lNone  Anesthetic total: NOne  Irrigation method: 4x4 Amount of cleaning: standard  Skin closure: Dermabond    Patient tolerance: Patient tolerated the procedure well with no immediate complications.  ----------------------------------------- 6:35 PM on 06/08/2016 -----------------------------------------  The patient CT head does not show any acute intracranial process, just shows contusion. He does have a distal first finger phalanx fracture, which I will splint, on the left side. I have repaired his lacerations, and given the patient and his family strict return precautions as well as follow-up instructions.  SPLINT APPLICATION Date/Time: 123456 PM Authorized by: Eula Listen Consent: Verbal consent obtained. Risks and benefits: risks, benefits and alternatives were discussed Consent given by: patient Splint applied by: ED technician Location details: left thumb Splint type: metal finger splint  Post-procedure: The splinted body part was neurovascularly unchanged following the procedure. Patient tolerance: Patient tolerated the procedure well with no immediate complications.      ____________________________________________  FINAL CLINICAL IMPRESSION(S) / ED DIAGNOSES  Final diagnoses:  None         NEW MEDICATIONS STARTED DURING THIS VISIT:  New Prescriptions   No medications on file      Eula Listen, MD 06/08/16 1836

## 2016-06-08 NOTE — ED Notes (Signed)
Pt waiting in room for fall safety for his grandson to arrive to pick him up

## 2016-06-08 NOTE — ED Notes (Signed)
Patient transported to CT 

## 2016-07-13 ENCOUNTER — Encounter: Payer: Self-pay | Admitting: *Deleted

## 2016-07-14 ENCOUNTER — Encounter: Payer: Self-pay | Admitting: Anesthesiology

## 2016-07-14 ENCOUNTER — Ambulatory Visit: Payer: Medicare Other | Admitting: Anesthesiology

## 2016-07-14 ENCOUNTER — Encounter
Admission: RE | Disposition: A | Discharge status: Home / Self Care | Payer: Self-pay | Source: Ambulatory Visit | Attending: Unknown Physician Specialty

## 2016-07-14 ENCOUNTER — Ambulatory Visit
Admission: RE | Admit: 2016-07-14 | Discharge: 2016-07-14 | Disposition: A | Discharge status: Home / Self Care | Payer: Medicare Other | Source: Ambulatory Visit | Attending: Unknown Physician Specialty | Admitting: Unknown Physician Specialty

## 2016-07-14 DIAGNOSIS — Z833 Family history of diabetes mellitus: Secondary | ICD-10-CM | POA: Diagnosis not present

## 2016-07-14 DIAGNOSIS — Z7951 Long term (current) use of inhaled steroids: Secondary | ICD-10-CM | POA: Diagnosis not present

## 2016-07-14 DIAGNOSIS — Z9889 Other specified postprocedural states: Secondary | ICD-10-CM | POA: Diagnosis not present

## 2016-07-14 DIAGNOSIS — R933 Abnormal findings on diagnostic imaging of other parts of digestive tract: Secondary | ICD-10-CM | POA: Diagnosis present

## 2016-07-14 DIAGNOSIS — I4891 Unspecified atrial fibrillation: Secondary | ICD-10-CM | POA: Diagnosis not present

## 2016-07-14 DIAGNOSIS — G4733 Obstructive sleep apnea (adult) (pediatric): Secondary | ICD-10-CM | POA: Diagnosis not present

## 2016-07-14 DIAGNOSIS — Z825 Family history of asthma and other chronic lower respiratory diseases: Secondary | ICD-10-CM | POA: Insufficient documentation

## 2016-07-14 DIAGNOSIS — Z823 Family history of stroke: Secondary | ICD-10-CM | POA: Diagnosis not present

## 2016-07-14 DIAGNOSIS — Z888 Allergy status to other drugs, medicaments and biological substances status: Secondary | ICD-10-CM | POA: Diagnosis not present

## 2016-07-14 DIAGNOSIS — G20 Parkinson's disease: Secondary | ICD-10-CM | POA: Diagnosis not present

## 2016-07-14 DIAGNOSIS — K210 Gastro-esophageal reflux disease with esophagitis: Secondary | ICD-10-CM | POA: Diagnosis not present

## 2016-07-14 DIAGNOSIS — R1310 Dysphagia, unspecified: Secondary | ICD-10-CM | POA: Insufficient documentation

## 2016-07-14 DIAGNOSIS — I10 Essential (primary) hypertension: Secondary | ICD-10-CM | POA: Insufficient documentation

## 2016-07-14 DIAGNOSIS — Z8262 Family history of osteoporosis: Secondary | ICD-10-CM | POA: Diagnosis not present

## 2016-07-14 DIAGNOSIS — Z8546 Personal history of malignant neoplasm of prostate: Secondary | ICD-10-CM | POA: Insufficient documentation

## 2016-07-14 DIAGNOSIS — K449 Diaphragmatic hernia without obstruction or gangrene: Secondary | ICD-10-CM | POA: Insufficient documentation

## 2016-07-14 DIAGNOSIS — Z882 Allergy status to sulfonamides status: Secondary | ICD-10-CM | POA: Diagnosis not present

## 2016-07-14 DIAGNOSIS — Z79899 Other long term (current) drug therapy: Secondary | ICD-10-CM | POA: Diagnosis not present

## 2016-07-14 DIAGNOSIS — Z7982 Long term (current) use of aspirin: Secondary | ICD-10-CM | POA: Insufficient documentation

## 2016-07-14 HISTORY — PX: ESOPHAGOGASTRODUODENOSCOPY (EGD) WITH PROPOFOL: SHX5813

## 2016-07-14 HISTORY — DX: Malignant (primary) neoplasm, unspecified: C80.1

## 2016-07-14 SURGERY — ESOPHAGOGASTRODUODENOSCOPY (EGD) WITH PROPOFOL
Anesthesia: General

## 2016-07-14 MED ORDER — SODIUM CHLORIDE 0.9 % IV SOLN
INTRAVENOUS | Status: DC
  Administered 2016-07-14: 1000 mL via INTRAVENOUS

## 2016-07-14 MED ORDER — SODIUM CHLORIDE 0.9 % IV SOLN: INTRAVENOUS | Status: DC

## 2016-07-14 MED ORDER — PROPOFOL 500 MG/50ML IV EMUL
INTRAVENOUS | Status: DC | PRN
  Administered 2016-07-14: 120 ug/kg/min via INTRAVENOUS

## 2016-07-14 MED ORDER — PROPOFOL 500 MG/50ML IV EMUL
INTRAVENOUS | Status: AC
  Filled 2016-07-14: qty 50

## 2016-07-14 MED ORDER — FENTANYL CITRATE (PF) 100 MCG/2ML IJ SOLN
INTRAMUSCULAR | Status: AC
  Filled 2016-07-14: qty 2

## 2016-07-14 MED ORDER — PROPOFOL 10 MG/ML IV BOLUS
INTRAVENOUS | Status: DC | PRN
  Administered 2016-07-14: 50 mg via INTRAVENOUS

## 2016-07-14 MED ORDER — LIDOCAINE 2% (20 MG/ML) 5 ML SYRINGE
INTRAMUSCULAR | Status: DC | PRN
  Administered 2016-07-14: 30 mg via INTRAVENOUS

## 2016-07-14 MED ORDER — FENTANYL CITRATE (PF) 100 MCG/2ML IJ SOLN
INTRAMUSCULAR | Status: DC | PRN
  Administered 2016-07-14: 25 ug via INTRAVENOUS

## 2016-07-14 MED ORDER — GLYCOPYRROLATE 0.2 MG/ML IJ SOLN
INTRAMUSCULAR | Status: AC
  Filled 2016-07-14: qty 1

## 2016-07-14 MED ORDER — LIDOCAINE HCL (PF) 2 % IJ SOLN
INTRAMUSCULAR | Status: AC
  Filled 2016-07-14: qty 2

## 2016-07-14 NOTE — H&P (Signed)
Primary Care Physician:  [place] Pink, MD Primary Gastroenterologist:  Dr. Vira Agar  Pre-Procedure History & Physical: HPI:  Gavin Huster. is a 83 y.o. male is here for an endoscopy.  He has dysphagia and difficulty swallowing.   Past Medical History:  Diagnosis Date  . Atrial fibrillation ([place])    controlled  . Cancer [facility])    Prostate Cancer  . Hypertension    controlled by meds  . Obstructive sleep apnea   . Parkinson disease [facility])     Past Surgical History:  Procedure Laterality Date  . HEMORRHOID SURGERY    . HERNIA REPAIR      Prior to Admission medications   Medication Sig Start Date End Date Taking? Authorizing Provider  aspirin EC 81 MG tablet Take by mouth. 01/13/10  Yes Historical Provider, MD  glycopyrrolate (ROBINUL) 1 MG tablet Take 1 mg by mouth 2 (two) times daily.   Yes Historical Provider, MD  Multiple Vitamin (MULTI-VITAMINS) TABS Take 1 tablet by mouth daily.    Yes Historical Provider, MD  potassium chloride SA (K-DUR,KLOR-CON) 20 MEQ tablet Take 20 mEq by mouth daily.   Yes Historical Provider, MD  ramipril (ALTACE) 5 MG capsule Take 5 mg by mouth daily.   Yes Historical Provider, MD  TOPROL XL 25 MG 24 hr tablet Take 1 tablet by mouth daily. 03/03/16  Yes Historical Provider, MD  amiodarone (PACERONE) 200 MG tablet  02/13/16   Historical Provider, MD  pantoprazole (PROTONIX) 20 MG tablet Take 1 tablet (20 mg total) by mouth daily. Patient not taking: Reported on 04/07/2016 05/29/15 05/28/16  Daymon Larsen, MD  penicillin v potassium (VEETID) 500 MG tablet  02/15/16   Historical Provider, MD  PROAIR HFA [phone] BASE) MCG/ACT inhaler  10/26/14   Historical Provider, MD  rOPINIRole (REQUIP) 2 MG tablet  05/12/16   Historical Provider, MD    Allergies as of 07/12/2016 - Review Complete 06/08/2016  Allergen Reaction Noted  . Bromide ion [bromine]  04/07/2016  . Gabapentin Nausea Only 12/30/2014  . Quinine Other (See Comments)   . Quinine derivatives   04/07/2016  . Sulfa antibiotics Other (See Comments)   . Tramadol Other (See Comments) 04/07/2016    Family History  Problem Relation Age of Onset  . Stroke Mother   . Diabetes Mellitus II Mother   . Osteoporosis Mother   . Emphysema Father   . Asthma Father     Social History   Social History  . Marital status: Married    Spouse name: N/A  . Number of children: N/A  . Years of education: N/A   Occupational History  . Not on file.   Social History Main Topics  . Smoking status: Never Smoker  . Smokeless tobacco: Never Used  . Alcohol use No  . Drug use: No  . Sexual activity: Not on file   Other Topics Concern  . Not on file   Social History Narrative  . No narrative on file    Review of Systems: See HPI, otherwise negative ROS  Physical Exam: BP 103/72   Pulse 66   Temp 97.5 F (36.4 C)   Resp 14   Ht 5\' 8"  (1.727 m)   Wt 63.5 kg (140 lb)   SpO2 99%   BMI 21.29 kg/m  General:   Alert,  pleasant and cooperative in NAD Head:  Normocephalic and atraumatic. Neck:  Supple; no masses or thyromegaly. Lungs:  Clear throughout to auscultation.  Heart:  Regular rate and rhythm. Abdomen:  Soft, nontender and nondistended. Normal bowel sounds, without guarding, and without rebound.   Neurologic:  Alert and  oriented x4;  grossly normal neurologically.  Impression/Plan: Gavin Bale. is here for an endoscopy to be performed for dysphagia and abnormality on barium swallow showing stenosis of the esophagus  Risks, benefits, limitations, and alternatives regarding  endoscopy have been reviewed with the patient.  Questions have been answered.  All parties agreeable.   Gaylyn Cheers, MD  07/14/2016, 9:38 AM

## 2016-07-14 NOTE — Transfer of Care (Signed)
Immediate Anesthesia Transfer of Care Note  Patient: Gavin Beltran.  Procedure(s) Performed: Procedure(s): ESOPHAGOGASTRODUODENOSCOPY (EGD) WITH PROPOFOL (N/A)  Patient Location: PACU, Short Stay and Endoscopy Unit  Anesthesia Type:General  Level of Consciousness: sedated  Airway & Oxygen Therapy: Patient Spontanous Breathing and Patient connected to nasal cannula oxygen  Post-op Assessment: Report given to RN and Post -op Vital signs reviewed and stable  Post vital signs: Reviewed and stable  Last Vitals:  Vitals:   07/14/16 0742  BP: 103/72  Pulse: 77  Resp: 16  Temp: (!) 36 C    Last Pain:  Vitals:   07/14/16 0742  TempSrc: Tympanic         Complications: No apparent anesthesia complications

## 2016-07-14 NOTE — Anesthesia Preprocedure Evaluation (Signed)
Anesthesia Evaluation  Patient identified by MRN, date of birth, ID band Patient awake    Reviewed: Allergy & Precautions, H&P , NPO status , Patient's Chart, lab work & pertinent test results  History of Anesthesia Complications Negative for: history of anesthetic complications  Airway Mallampati: III  TM Distance: <3 FB Neck ROM: limited    Dental  (+) Poor Dentition, Chipped, Caps   Pulmonary sleep apnea ,    Pulmonary exam normal breath sounds clear to auscultation       Cardiovascular Exercise Tolerance: Good hypertension, (-) Past MI Normal cardiovascular exam Rhythm:regular Rate:Normal     Neuro/Psych  Neuromuscular disease negative psych ROS   GI/Hepatic Neg liver ROS, GERD  Medicated and Controlled,  Endo/Other  negative endocrine ROS  Renal/GU negative Renal ROS  negative genitourinary   Musculoskeletal   Abdominal   Peds  Hematology negative hematology ROS (+)   Anesthesia Other Findings Recent fall with bruising to the face  Past Medical History: No date: Atrial fibrillation ([facility])     Comment: controlled No date: Cancer ([place])     Comment: Prostate Cancer No date: Hypertension     Comment: controlled by meds No date: Obstructive sleep apnea No date: Parkinson disease ([place])  Past Surgical History: No date: HEMORRHOID SURGERY No date: HERNIA REPAIR  BMI    Body Mass Index:  21.29 kg/m      Reproductive/Obstetrics negative OB ROS                             Anesthesia Physical Anesthesia Plan  ASA: III  Anesthesia Plan: General   Post-op Pain Management:    Induction:   Airway Management Planned:   Additional Equipment:   Intra-op Plan:   Post-operative Plan:   Informed Consent: I have reviewed the patients History and Physical, chart, labs and discussed the procedure including the risks, benefits and alternatives for the proposed anesthesia with the  patient or authorized representative who has indicated his/her understanding and acceptance.   Dental Advisory Given  Plan Discussed with: Anesthesiologist, CRNA and Surgeon  Anesthesia Plan Comments: (Patient and family informed that patient is higher risk for complications from anesthesia during this procedure due to their medical history and age including but not limited to post operative cognitive dysfunction.  They voiced understanding. )        Anesthesia Quick Evaluation

## 2016-07-14 NOTE — Op Note (Signed)
[name] Gastroenterology Patient Name: Gavin Beltran Procedure Date: 07/14/2016 9:04 AM MRN: [id number] Account #: [id number] Date of Birth: Jul 24, 1923 Admit Type: Outpatient Age: 83 Room: [facility] ENDO ROOM 3 Gender: Male Note Status: Finalized Procedure:            Upper GI endoscopy Indications:          Dysphagia, Abnormal UGI series Providers:            Manya Silvas, MD Referring MD:         Irven Easterly. Kary Kos, MD (Referring MD) Medicines:            Propofol per Anesthesia Complications:        No immediate complications. Procedure:            Pre-Anesthesia Assessment:                       - After reviewing the risks and benefits, the patient                        was deemed in satisfactory condition to undergo the                        procedure.                       After obtaining informed consent, the endoscope was                        passed under direct vision. Throughout the procedure,                        the patient's blood pressure, pulse, and oxygen                        saturations were monitored continuously. The Endoscope                        was introduced through the mouth, and advanced to the                        second part of duodenum. The upper GI endoscopy was                        accomplished without difficulty. The patient tolerated                        the procedure well. Findings:      LA Grade B (one or more mucosal breaks greater than 5 mm, not extending       between the tops of two mucosal folds) esophagitis with no bleeding was       found 35 cm from the incisors. At the end of the procedure A guide wire       was inserted and the scope withdrawn and a 32 F Savary dilator passed       easily and a 59F Savary dilator also passed after the first one.      Moderately severe esophagitis with no bleeding was found 38 cm from the       incisors. Biopsies were taken with a cold forceps for histology.   Possible Barretts esophagitis.  A medium-sized hiatal hernia was present.      The first portion of the duodenum and second portion of the duodenum       were normal. Impression:           - LA Grade B acute esophagitis.                       - Moderately severe reflux esophagitis. Rule out                        Barrett's esophagus. Biopsied.                       - Medium-sized hiatal hernia.                       - Normal first portion of the duodenum and second                        portion of the duodenum. Recommendation:       - Await pathology results.                       - The findings and recommendations were discussed with                        the patient's family.                       - soft food for 3 days, eat slowly, chew well, take                        small bites Manya Silvas, MD 07/14/2016 9:35:51 AM This report has been signed electronically. Number of Addenda: 0 Note Initiated On: 07/14/2016 9:04 AM      [facility]

## 2016-07-14 NOTE — Anesthesia Postprocedure Evaluation (Signed)
Anesthesia Post Note  Patient: Gavin Beltran.  Procedure(s) Performed: Procedure(s) (LRB): ESOPHAGOGASTRODUODENOSCOPY (EGD) WITH PROPOFOL (N/A)  Patient location during evaluation: Endoscopy Anesthesia Type: General Level of consciousness: awake and alert Pain management: pain level controlled Vital Signs Assessment: post-procedure vital signs reviewed and stable Respiratory status: spontaneous breathing, nonlabored ventilation, respiratory function stable and patient connected to nasal cannula oxygen Cardiovascular status: blood pressure returned to baseline and stable Postop Assessment: no signs of nausea or vomiting Anesthetic complications: no     Last Vitals:  Vitals:   07/14/16 0956 07/14/16 1006  BP: 101/68 101/66  Pulse: 65 71  Resp: 16 17  Temp:      Last Pain:  Vitals:   07/14/16 0937  TempSrc: Tympanic                 [name]

## 2016-07-14 NOTE — Anesthesia Post-op Follow-up Note (Cosign Needed)
Anesthesia QCDR form completed.        

## 2016-07-15 NOTE — Progress Notes (Signed)
Pt had slight difficulty urinating ,but is ok today. I told him sometimes medications will cause that to call md if problem continues.

## 2016-07-17 ENCOUNTER — Encounter: Payer: Self-pay | Admitting: Unknown Physician Specialty

## 2016-07-17 LAB — SURGICAL PATHOLOGY

## 2016-07-19 ENCOUNTER — Other Ambulatory Visit: Payer: Self-pay | Admitting: Student

## 2016-07-20 ENCOUNTER — Other Ambulatory Visit: Payer: Self-pay | Admitting: Student

## 2016-07-20 DIAGNOSIS — R1312 Dysphagia, oropharyngeal phase: Secondary | ICD-10-CM

## 2016-07-21 ENCOUNTER — Ambulatory Visit
Admission: RE | Admit: 2016-07-21 | Discharge: 2016-07-21 | Disposition: A | Discharge status: Home / Self Care | Payer: Medicare Other | Source: Ambulatory Visit | Attending: Student | Admitting: Student

## 2016-07-21 DIAGNOSIS — G4733 Obstructive sleep apnea (adult) (pediatric): Secondary | ICD-10-CM | POA: Diagnosis not present

## 2016-07-21 DIAGNOSIS — G20 Parkinson's disease: Secondary | ICD-10-CM | POA: Insufficient documentation

## 2016-07-21 DIAGNOSIS — I10 Essential (primary) hypertension: Secondary | ICD-10-CM | POA: Diagnosis not present

## 2016-07-21 DIAGNOSIS — Z8546 Personal history of malignant neoplasm of prostate: Secondary | ICD-10-CM | POA: Insufficient documentation

## 2016-07-21 DIAGNOSIS — R1313 Dysphagia, pharyngeal phase: Secondary | ICD-10-CM | POA: Insufficient documentation

## 2016-07-21 DIAGNOSIS — I4891 Unspecified atrial fibrillation: Secondary | ICD-10-CM | POA: Diagnosis not present

## 2016-07-21 NOTE — Therapy (Signed)
[facility] [place], [place], [address] Phone: [phone]   Fax:     Modified Barium Swallow  Patient Details  Name: Gavin Beltran. MRN: [id number] Date of Birth: November 10, 1923 Referring Provider: Dr. Manuella Ghazi  Encounter Date: 07/21/2016      End of Session - 07/21/16 1729    Visit Number 1   Number of Visits 1   Date for SLP Re-Evaluation 07/21/16   SLP Start Time 1240   SLP Stop Time  1400   SLP Time Calculation (min) 80 min   Activity Tolerance Patient tolerated treatment well      Past Medical History:  Diagnosis Date  . Atrial fibrillation ([place])    controlled  . Cancer [facility])    Prostate Cancer  . Hypertension    controlled by meds  . Obstructive sleep apnea   . Parkinson disease [facility])     Past Surgical History:  Procedure Laterality Date  . ESOPHAGOGASTRODUODENOSCOPY (EGD) WITH PROPOFOL N/A 07/14/2016   Procedure: ESOPHAGOGASTRODUODENOSCOPY (EGD) WITH PROPOFOL;  Surgeon: Manya Silvas, MD;  Location: [facility] ENDOSCOPY;  Service: Endoscopy;  Laterality: N/A;  . HEMORRHOID SURGERY    . HERNIA REPAIR      There were no vitals filed for this visit.   Subjective: Patient behavior: (alertness, ability to follow instructions, etc.): The patient and his wife have some difficulty fully explaining his swallowing complaints  Chief complaint: Parkinson's disease, recent worsening of swallowing (not able to swallow solids, only "mushy" foods and liquids), and need to cough/spit up undigested food after every meal (patient and wife are not clear on this).   Objective:  Radiological Procedure: A videoflouroscopic evaluation of oral-preparatory, reflex initiation, and pharyngeal phases of the swallow was performed; as well as a screening of the upper esophageal phase.  I. POSTURE: Upright in MBS chair  II. VIEW: Lateral  III. COMPENSATORY STRATEGIES: Liquid "chaser" helps reduce pharyngeal  residue  IV. BOLUSES ADMINISTERED:   Thin Liquid: 1 teaspoon presentation   Nectar-thick Liquid: 1 sip   Honey-thick Liquid: N/A   Puree: 2 teaspoon presentations   Mechanical Soft: DNT  V. RESULTS OF EVALUATION: A. ORAL PREPARATORY PHASE: (The lips, tongue, and velum are observed for strength and coordination)       **Overall Severity Rating: Mildly disorganized, within functional limits  B. SWALLOW INITIATION/REFLEX: (The reflex is normal if "triggered" by the time the bolus reached the base of the tongue)  **Overall Severity Rating: Moderate; triggers while falling from the valleculae to the pyriform sinuses  C. PHARYNGEAL PHASE: (Pharyngeal function is normal if the bolus shows rapid, smooth, and continuous transit through the pharynx and there is no pharyngeal residue after the swallow)  **Overall Severity Rating: Severe; poor pharyngeal  pressure generation (significantly reduced tongue base retraction and loss of bolus to nasopharynx) and poor UES opening (poor anterior hyolaryngeal movement and absent epiglottic inversion).    D. LARYNGEAL PENETRATION: (Material entering into the laryngeal inlet/vestibule but not aspirated) X2 from pharyngeal residue  E. ASPIRATION: None observed during this evaluation  F. ESOPHAGEAL PHASE: (Screening of the upper esophagus): could not view due to shoulder shadow- patient had a recent barium swallow study  ASSESSMENT: This 83 year old man with; Parkinson's disease, recent worsening of swallowing (not able to swallow solids, only "mushy" foods and liquids), and need to cough/spit up undigested food after every meal (patient and wife are not clear on this); is presenting with severe pharyngeal dysphagia  characterized by poor pharyngeal  pressure generation (significantly reduced tongue base retraction and loss of bolus to nasopharynx) and poor UES opening (poor anterior hyolaryngeal movement and absent epiglottic inversion).  There is minimal  entry of pureed boluses into the esophagus with severe pharyngeal residue.  There is improved entry of liquids into the esophagus with mild-moderate pharyngeal residue.  Laryngeal vestibule penetration was observed with pharyngeal residue dripping into the vestibule.  Frank aspiration was not observed.  In view of how little of each bolus enters the esophagus, the patient is at significant risk for malnutrition.  He is also at risk for aspiration of pharyngeal residue.  Recommend that the patient and his wife be referred to a dietician to help devise nutrient/calorie dense oral intake plan and help determine if he can meet his needs by mouth.  The patient should be able to take a soft/moist diet with liquid supplements.  This study is significantly worse than the study we did 06/08/2015.  It is possible that swallowing exercises would improve the patient's swallowing function.  The patient was not clear if he was interested in pursuing exercises.  In my opinion, the patient's risk for malnutrition is far greater than risk for negative sequelae to aspiration.  PLAN/RECOMMENDATIONS:   A. Diet: Soft/moist solids   B. Swallowing Precautions: continue to clear pharynx after meals, small bites/sips,    C. Recommended consultation to: dietician- help devise nutrient/calorie dense oral intake plan and help determine if he can meet his needs by mouth; PCP to guide decisions regarding interventions in view of the severity of dysphagia   D. Therapy recommendations: for swallowing exercises if the pateint is willing to commit to exercise everyday, 3 times per day   E. Results and recommendations were discussed with the patient and his wife immediately following the study.  The final report will be routed to the referring MD.  Patient will benefit from skilled therapeutic intervention in order to improve the following deficits and impairments:   Pharyngeal dysphagia - Plan: DG OP Swallowing Func-Medicare/Speech  Path, DG OP Swallowing Func-Medicare/Speech Path      G-Codes - Aug 07, 2016 1730    Functional Assessment Tool Used MBSS, clinical judgment   Functional Limitations Swallowing   Swallow Current Status (G5364) At least 80 percent but less than 100 percent impaired, limited or restricted   Swallow Goal Status (W8032) At least 80 percent but less than 100 percent impaired, limited or restricted   Swallow Discharge Status [phone]) At least 80 percent but less than 100 percent impaired, limited or restricted          Problem List There are no active problems to display for this patient.   Lou Miner 08/07/16, 5:31 PM  [facility] [place], [place], [address] Phone: [phone]   Fax:     Name: Gavin Beltran. MRN: [id number] Date of Birth: Feb 02, 1924

## 2016-08-24 ENCOUNTER — Ambulatory Visit: Payer: Medicare Other | Admitting: Speech Pathology

## 2016-08-29 ENCOUNTER — Ambulatory Visit: Payer: Medicare Other | Admitting: Speech Pathology

## 2016-08-31 ENCOUNTER — Ambulatory Visit: Payer: Medicare Other | Admitting: Speech Pathology

## 2016-09-05 ENCOUNTER — Ambulatory Visit: Payer: Medicare Other | Admitting: Speech Pathology

## 2016-09-07 ENCOUNTER — Ambulatory Visit: Payer: Medicare Other | Admitting: Speech Pathology

## 2016-09-11 ENCOUNTER — Ambulatory Visit: Payer: Medicare Other | Admitting: Podiatry

## 2016-09-12 ENCOUNTER — Ambulatory Visit: Payer: Medicare Other | Admitting: Speech Pathology

## 2016-09-14 ENCOUNTER — Ambulatory Visit: Payer: Medicare Other | Admitting: Speech Pathology

## 2016-09-19 ENCOUNTER — Ambulatory Visit: Payer: Medicare Other | Admitting: Speech Pathology

## 2016-09-29 DEATH — deceased

## 2017-02-08 IMAGING — CT CT CERVICAL SPINE W/O CM
4 of 8 series · 15 of 33 positions shown, 16 images · non-contrast
Comparison: None.

CLINICAL DATA: Pain after fall.

EXAM:
CT HEAD WITHOUT CONTRAST
CT CERVICAL SPINE WITHOUT CONTRAST
TECHNIQUE: Multidetector CT imaging of the head and cervical spine was
performed following the standard protocol without intravenous
contrast. Multiplanar CT image reconstructions of the cervical spine
were also generated.

[Series 5: c spine soft · axial · 0.46mm/px · z∈[-259,-185]mm · 3 of 68 slices shown]
[im 17/68  soft-tissue]
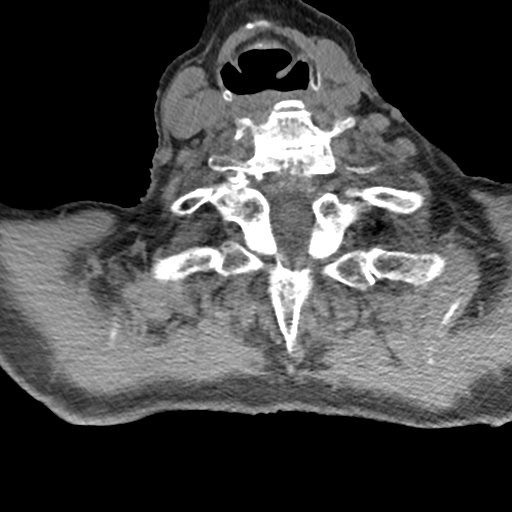
[im 34/68  soft-tissue]
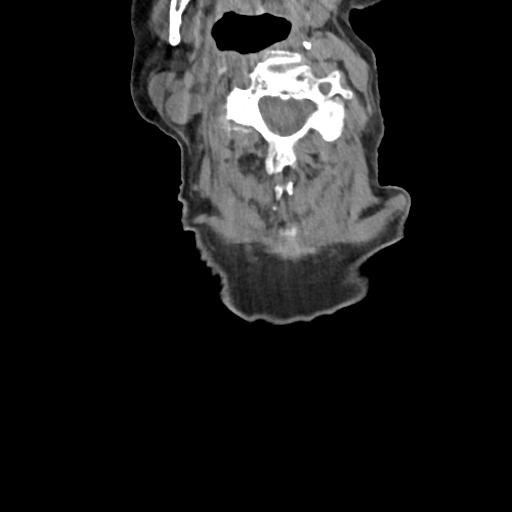
[im 51/68  soft-tissue]
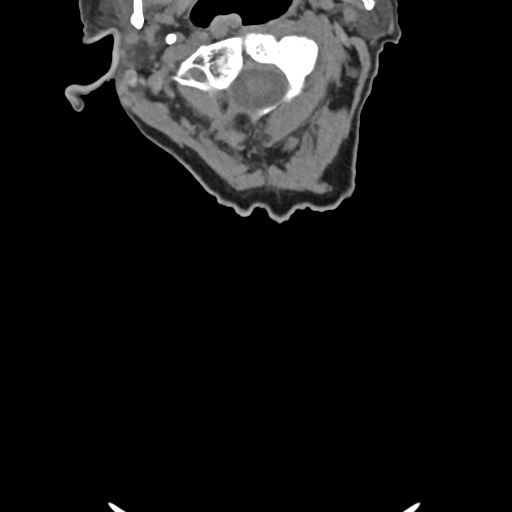

[Series 8: coronal soft tissue · coronal · 0.28mm/px · 3 of 69 slices shown]
[im 18/69  bone]
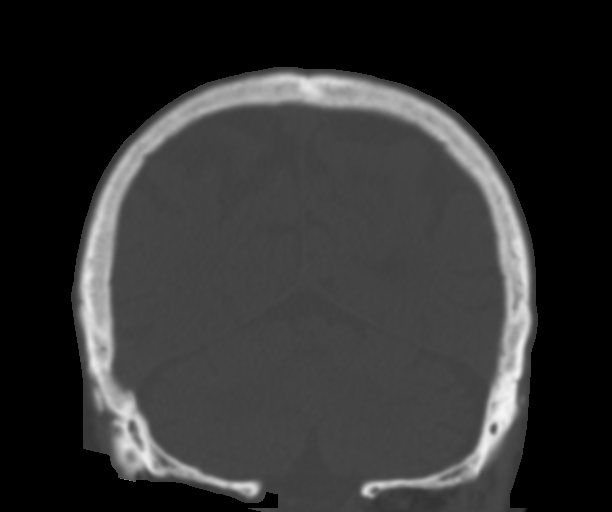
[im 35/69  bone]
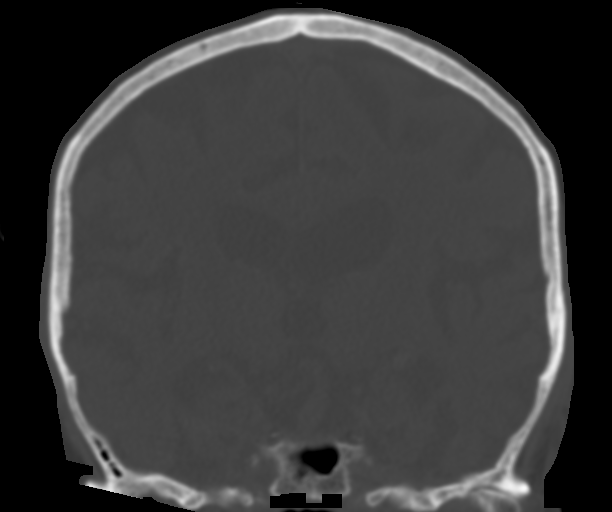
[im 52/69  bone]
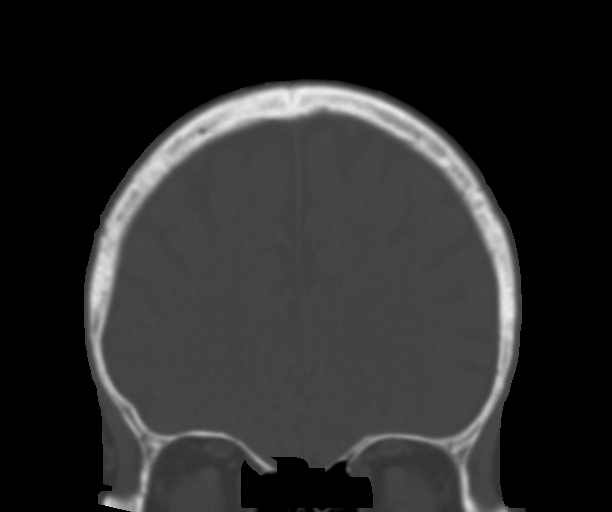

[Series 12: sagittal bone · sagittal · 0.31mm/px · 5 of 56 slices shown]
[im 10/56  bone]
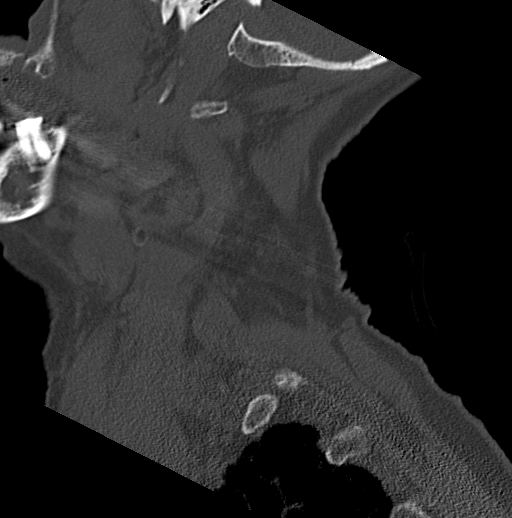
[im 19/56  bone]
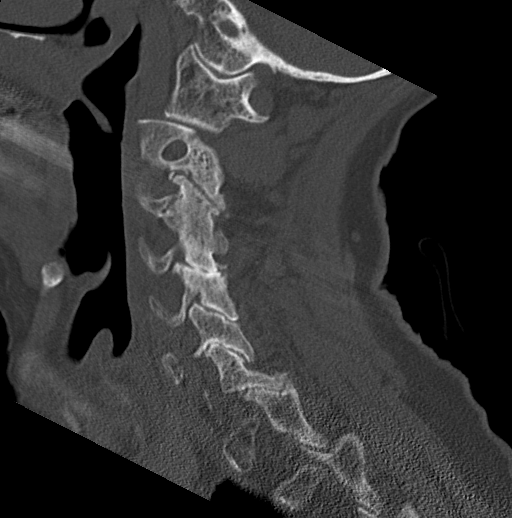
[im 28/56  bone]
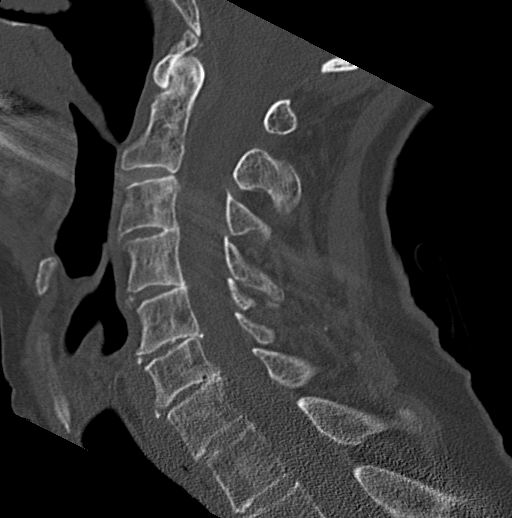
[im 37/56  bone]
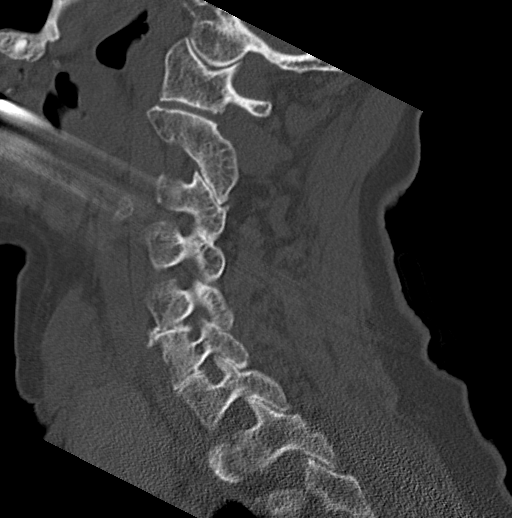
[im 46/56  bone]
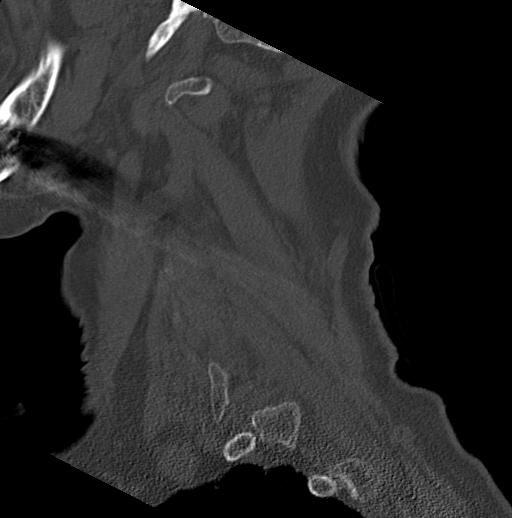

[Series 14: orthogonal bone · axial · 0.25mm/px · z∈[-303,-224]mm · 4 of 83 slices shown, 5 images]
[im 17/83  soft-tissue]
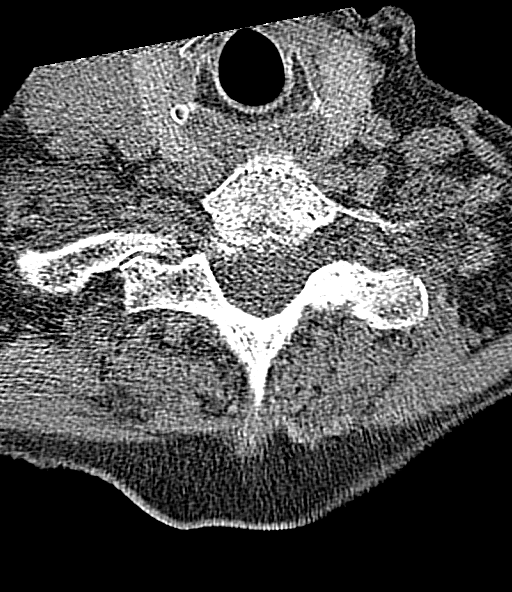
[im 17/83  bone]
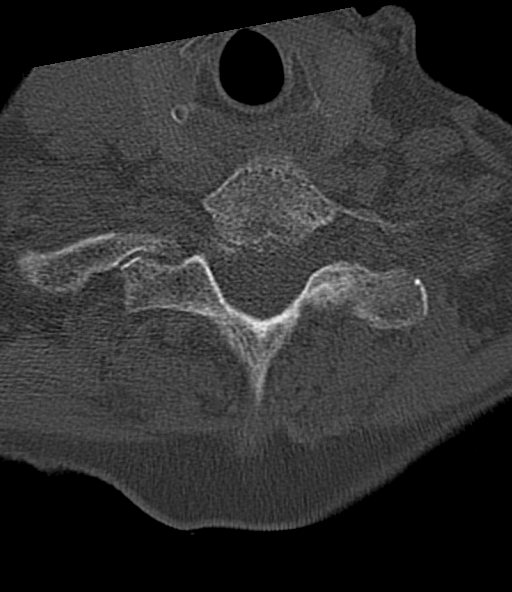
[im 33/83  bone]
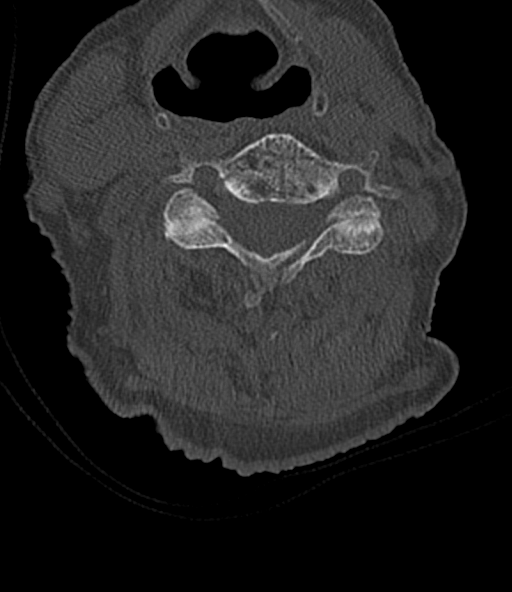
[im 50/83  bone]
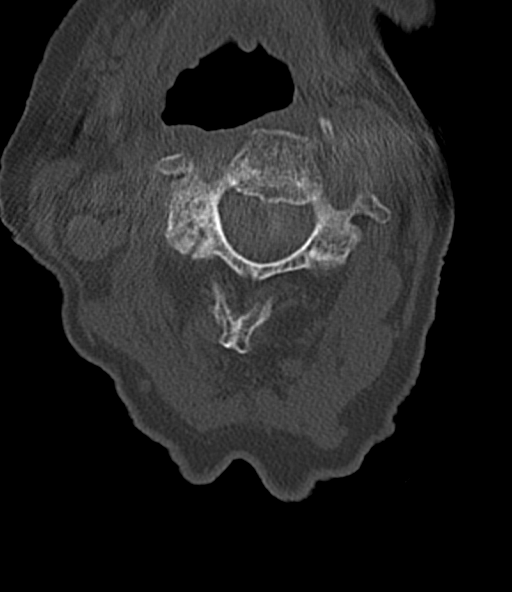
[im 66/83  bone]
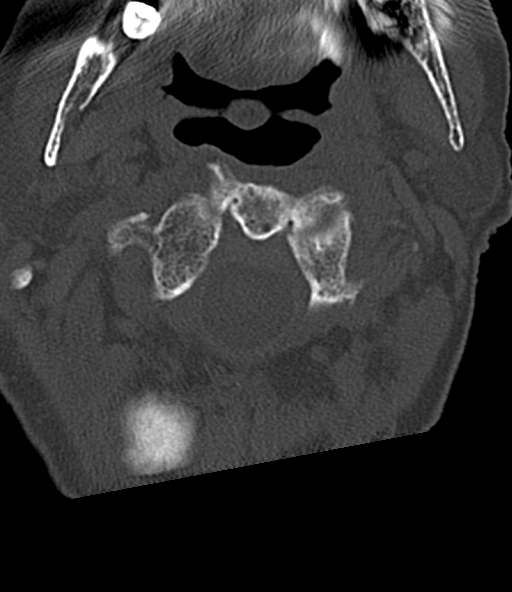

[15 of 33 positions shown; findings below may reference images not displayed]

FINDINGS: CT HEAD FINDINGS

Brain: No subdural, epidural, or subarachnoid hemorrhage. Cerebellum
brainstem are normal. Basal cisterns are patent. Ventricles are
prominent but stable. Scattered white matter changes are relatively
mild. No acute cortical ischemia or infarct. No mass, mass effect,
or midline shift.

Vascular: Calcified atherosclerotic changes are seen in the
intracranial carotid arteries.

Skull: Normal. Negative for fracture or focal lesion.

Sinuses/Orbits: No acute finding.

Other: None.

CT CERVICAL SPINE FINDINGS

Alignment: Normal.

Skull base and vertebrae: No acute fracture. No primary bone lesion
or focal pathologic process.

Soft tissues and spinal canal: No prevertebral fluid or swelling. No
visible canal hematoma.

Disc levels:  Multilevel degenerative changes.

Upper chest: Negative.

Other: No other abnormalities.
IMPRESSION: 1. No acute intracranial abnormality.
2. No fracture or traumatic malalignment in the cervical spine.

## 2017-05-24 IMAGING — RF DG SWALLOWING FUNCTION
1 series · 1 of 1 positions shown · non-contrast
Comparison: None.

CLINICAL DATA: Oropharyngeal dysphagia. Abnormal barium swallow.
Difficulty swallowing food and liquid x 1 month with weightloss.
Coughs and eventually spits it out.

EXAM:
MODIFIED BARIUM SWALLOW
TECHNIQUE: Different consistencies of barium were administered orally to the
patient by the Speech Pathologist. Imaging of the pharynx was
performed in the lateral projection.
FLUOROSCOPY TIME:  Fluoroscopy Time:  54 seconds
Radiation Exposure Index (if provided by the fluoroscopic device):
0.6 mGy
Number of Acquired Spot Images: 0

[Series 10: cp_standard · 0.25mm/px · 1 of 1 slices shown]
[im 1/1]
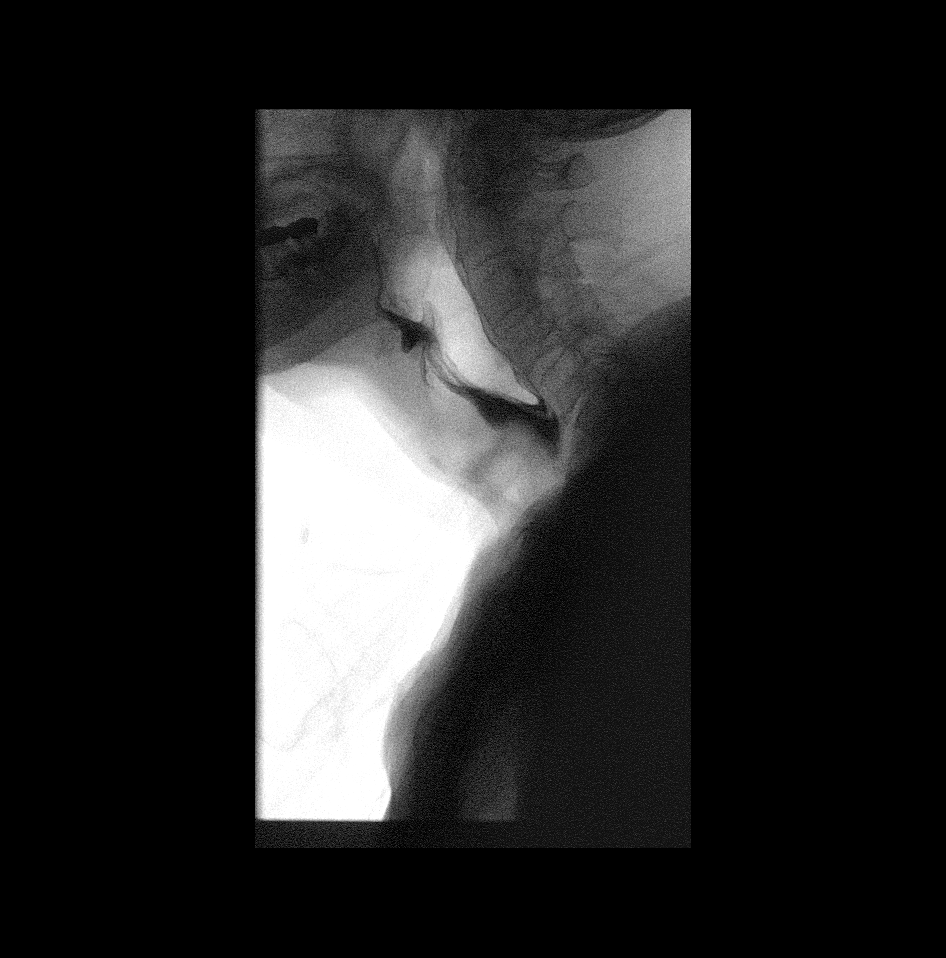

[1 of 1 positions shown; findings below may reference images not displayed]

FINDINGS: Thin liquid- premature spillage with residual contrast in the
valleculae. Laryngeal penetration with trace tracheal aspiration
intermittently. No significant cough reflex.

Nectar thick liquid- premature spillage with large residual contrast
in the valleculae.

Honey- within normal limits

Yadi?Drey premature spillage with a majority in the valleculae.
IMPRESSION: Modified barium swallow as described above.

Please refer to the Speech Pathologists report for complete details
and recommendations.
